# Patient Record
Sex: Female | Born: 1970
Health system: Southern US, Community
[De-identification: ages and names within clinical notes are randomized; demographics above are authoritative.]

## PROBLEM LIST (undated history)

## (undated) DIAGNOSIS — R0602 Shortness of breath: Secondary | ICD-10-CM

## (undated) DIAGNOSIS — D649 Anemia, unspecified: Secondary | ICD-10-CM

## (undated) DIAGNOSIS — E119 Type 2 diabetes mellitus without complications: Secondary | ICD-10-CM

## (undated) HISTORY — DX: Anemia, unspecified: D64.9

## (undated) HISTORY — DX: Shortness of breath: R06.02

---

## 1998-07-10 ENCOUNTER — Other Ambulatory Visit: Admission: RE | Admit: 1998-07-10 | Discharge: 1998-07-10 | Payer: Self-pay | Admitting: Obstetrics and Gynecology

## 1998-10-02 ENCOUNTER — Emergency Department (HOSPITAL_COMMUNITY): Admission: EM | Admit: 1998-10-02 | Discharge: 1998-10-02 | Payer: Self-pay | Admitting: Emergency Medicine

## 1998-10-02 ENCOUNTER — Encounter: Payer: Self-pay | Admitting: Emergency Medicine

## 2002-10-22 ENCOUNTER — Emergency Department (HOSPITAL_COMMUNITY): Admission: EM | Admit: 2002-10-22 | Discharge: 2002-10-22 | Payer: Self-pay | Admitting: Emergency Medicine

## 2004-10-24 ENCOUNTER — Emergency Department (HOSPITAL_COMMUNITY): Admission: EM | Admit: 2004-10-24 | Discharge: 2004-10-24 | Payer: Self-pay | Admitting: Emergency Medicine

## 2005-06-24 ENCOUNTER — Other Ambulatory Visit: Admission: RE | Admit: 2005-06-24 | Discharge: 2005-06-24 | Payer: Self-pay | Admitting: Obstetrics and Gynecology

## 2005-06-25 ENCOUNTER — Other Ambulatory Visit: Admission: RE | Admit: 2005-06-25 | Discharge: 2005-06-25 | Payer: Self-pay | Admitting: Obstetrics and Gynecology

## 2006-01-26 ENCOUNTER — Inpatient Hospital Stay (HOSPITAL_COMMUNITY): Admission: RE | Admit: 2006-01-26 | Discharge: 2006-01-29 | Payer: Self-pay | Admitting: Obstetrics and Gynecology

## 2006-10-25 ENCOUNTER — Encounter: Admission: RE | Admit: 2006-10-25 | Discharge: 2006-10-25 | Payer: Self-pay | Admitting: Occupational Medicine

## 2006-12-28 ENCOUNTER — Ambulatory Visit: Payer: Self-pay | Admitting: Family Medicine

## 2006-12-28 LAB — CONVERTED CEMR LAB
ALT: 10 units/L (ref 0–40)
Albumin: 3.5 g/dL (ref 3.5–5.2)
Alkaline Phosphatase: 100 units/L (ref 39–117)
BUN: 11 mg/dL (ref 6–23)
Basophils Relative: 0.9 % (ref 0.0–1.0)
CO2: 33 meq/L — ABNORMAL HIGH (ref 19–32)
Calcium: 8.9 mg/dL (ref 8.4–10.5)
Creatinine, Ser: 0.8 mg/dL (ref 0.4–1.2)
HDL: 44.5 mg/dL (ref >39.0)
LDL Cholesterol: 82 mg/dL (ref 0–99)
Monocytes Relative: 6.2 % (ref 3.0–11.0)
Platelets: 275 10*3/uL (ref 150–400)
RBC: 3.62 M/uL — ABNORMAL LOW (ref 3.87–5.11)
RDW: 12.8 % (ref 11.5–14.6)
Total Bilirubin: 0.7 mg/dL (ref 0.3–1.2)
Total CHOL/HDL Ratio: 3.1
Total Protein: 7.8 g/dL (ref 6.0–8.3)
Triglycerides: 61 mg/dL (ref 0–149)
VLDL: 12 mg/dL (ref 0–40)

## 2008-07-23 ENCOUNTER — Ambulatory Visit: Payer: Self-pay | Admitting: Family Medicine

## 2008-07-23 DIAGNOSIS — D649 Anemia, unspecified: Secondary | ICD-10-CM | POA: Insufficient documentation

## 2008-08-25 ENCOUNTER — Emergency Department (HOSPITAL_COMMUNITY): Admission: EM | Admit: 2008-08-25 | Discharge: 2008-08-25 | Payer: Self-pay | Admitting: Emergency Medicine

## 2008-08-26 ENCOUNTER — Ambulatory Visit: Payer: Self-pay | Admitting: Family Medicine

## 2008-08-27 ENCOUNTER — Encounter: Admission: RE | Admit: 2008-08-27 | Discharge: 2008-08-27 | Payer: Self-pay | Admitting: Family Medicine

## 2008-08-28 ENCOUNTER — Telehealth: Payer: Self-pay | Admitting: Family Medicine

## 2008-08-28 ENCOUNTER — Encounter: Admission: RE | Admit: 2008-08-28 | Discharge: 2008-08-28 | Payer: Self-pay | Admitting: Family Medicine

## 2008-09-03 ENCOUNTER — Ambulatory Visit: Payer: Self-pay | Admitting: Family Medicine

## 2008-09-11 ENCOUNTER — Emergency Department (HOSPITAL_COMMUNITY): Admission: EM | Admit: 2008-09-11 | Discharge: 2008-09-11 | Payer: Self-pay | Admitting: Emergency Medicine

## 2008-09-13 ENCOUNTER — Ambulatory Visit: Payer: Self-pay | Admitting: Family Medicine

## 2008-09-16 ENCOUNTER — Telehealth: Payer: Self-pay | Admitting: Family Medicine

## 2008-09-22 ENCOUNTER — Emergency Department (HOSPITAL_COMMUNITY): Admission: EM | Admit: 2008-09-22 | Discharge: 2008-09-22 | Payer: Self-pay | Admitting: Emergency Medicine

## 2008-10-30 ENCOUNTER — Encounter: Payer: Self-pay | Admitting: Family Medicine

## 2008-12-11 ENCOUNTER — Encounter: Payer: Self-pay | Admitting: Family Medicine

## 2009-05-21 ENCOUNTER — Emergency Department (HOSPITAL_COMMUNITY): Admission: EM | Admit: 2009-05-21 | Discharge: 2009-05-21 | Payer: Self-pay | Admitting: Family Medicine

## 2011-01-22 NOTE — Discharge Summary (Signed)
[name], Tammy Delgado              ACCOUNT NO.:  [id number]   MEDICAL RECORD NO.:  [id number]          PATIENT TYPE:  INP   LOCATION:  9125                          FACILITY:  [facility]   PHYSICIAN:  Malva Limes, M.D.    DATE OF BIRTH:  [birth year]   DATE OF ADMISSION:  01/26/2006  DATE OF DISCHARGE:  01/29/2006                                 DISCHARGE SUMMARY   DISCHARGE DIAGNOSIS:  Intrauterine pregnancy at 39-1/7 weeks' gestation with  history of prior cesarean section.  The patient desires repeat cesarean  section. r   PROCEDURE:  Repeat low segment transverse cesarean section.  Surgeon Dr.  Conley Simmonds, assistant Dr. Ilda Mori.  Complications none.   HISTORY OF PRESENT ILLNESS:  This is a 40 year old, G2, P1 presents at 59  weeks' gestation for repeat cesarean section.  She had a prior cesarean  section with her last pregnancy secondary to failure to progress and  throughout this pregnancy has expressed her desire for repeat cesarean  section and declined VBAC.  The patient's antepartum course up to this point  has been uncomplicated.  She did have an abnormal quad screen that showed  elevated risk for Down syndrome, but she declined amniocentesis.  Otherwise,  her antepartum course had been uncomplicated.  She was taken to the  operating room on Jan 26, 2006, by Dr. Conley Simmonds where repeat low segment  transverse cesarean section was performed with the delivery of an 8 pound  female infant with Apgar's of 8 and 9.  The delivery went without  complications.   HOSPITAL COURSE:  The patient's postoperative course was benign without any  significant fevers.  She did have some postoperative anemia.  Her hemoglobin  dropped to 7.8, but she was asymptomatic and doing well.  She was felt ready  for discharge on postop day #2.   DIET:  She was sent home on a regular diet.   ACTIVITY:  She was told to decrease her activities.   DISCHARGE MEDICATIONS:  1.  Continue her prenatal  vitamins and iron supplement for her anemia.  Make      sure she increase her fluids.  2.  Percocet one to two every 4 hours as needed for pain.   FOLLOW UP:  She was to follow up in our office in 4 weeks, of course, to  call with any increased pain, bleeding or temperatures.   DISCHARGE LABORATORY DATA AND X-RAY FINDINGS:  The patient had a hemoglobin  of 7.8, white blood cell count of 10.7, platelets at 227,000.  Her  preoperative hemoglobin was only 8.8, so this was not a huge drop.      Leilani Able, P.A.-C.    ______________________________  Malva Limes, M.D.    MB/MEDQ  D:  03/06/2006  T:  03/07/2006  Job:  [id number]

## 2011-01-22 NOTE — Op Note (Signed)
Tammy Delgado, Tammy Delgado              ACCOUNT NO.:  [id number]   MEDICAL RECORD NO.:  [id number]          PATIENT TYPE:  INP   LOCATION:  9125                          [facility]ACILITY:  [facility]   PHYSICIAN:  Randye Lobo, M.D.   DATE OF BIRTH:  November 13, 1970   DATE OF PROCEDURE:  01/26/2006  DATE OF DISCHARGE:                                 OPERATIVE REPORT   PREOPERATIVE DIAGNOSIS:  1.  Intrauterine gestation at 39+1 weeks.  2..  History of prior cesarean section, desire for repeat cesarean section.   POSTOPERATIVE DIAGNOSIS:  1..  Intrauterine gestation at 39+1 weeks.  2..  History of prior cesarean section, desire for repeat cesarean section.   PROCEDURE:  Repeat low segment transverse cesarean section.   SURGEON:  Randye Lobo, M.D.   ASSISTANT:  Ilda Mori, M.D.   ANESTHESIA:  Spinal.   IV FLUIDS:  3700 mL Ringer's lactate.   ESTIMATED BLOOD LOSS:  750 mL.   URINE OUTPUT:  150 mL.   COMPLICATIONS:  None.   INDICATIONS FOR THE PROCEDURE:  The patient is a 40 year old gravida 2, para  1, African-American female at 39+1 weeks' gestation by early ultrasound who  had a history of a prior cesarean section several years ago for failure to  progress.  The patient chose to proceed with a repeat cesarean section for  delivery for this pregnancy; after risks, benefits, and alternatives were  discussed with her.   FINDINGS:  A viable female was delivered at 3:13 p.m.  Apgars were 8 at 1  minute and 9 at 5 minutes.  The weight was 8 pounds.  The amniotic fluid was  clear.  The uterus, tubes, and ovaries were unremarkable.  The placenta had  a normal insertion of a 3-vessel cord.   SPECIMENS:  None.   DESCRIPTION OF PROCEDURE:  The patient was reidentified in the preoperative  hold area.  She was brought to the operating room where a spinal anesthetic  was administered.  The patient was then placed in a supine position; and the  abdomen was sterilely prepped and a Foley catheter was  placed in the  bladder.  She was sterilely draped.   A Pfannenstiel incision was created along the patient's previous  Pfannenstiel scar.  The incision was created sharply with the scalpel.  Hemostasis was created with monopolar cautery in the subcutaneous layer.  The fascia was then incised, in the midline, with a scalpel; and the an  incision was extended bilaterally with the Mayo scissors.  The rectus  muscles were sharply dissected off of the overlying fascia superiorly and  inferiorly.  The rectus muscles were sharply divided in the midline.  The  parietal peritoneum was entered sharply after it was elevated with the  hemostat clamp.  The incision was extended cranially and caudally.   The lower uterine segment was exposed; and the bladder flap was sharply  created.  A transverse lower uterine segment incision was created sharply  with a scalpel; and the incision was extended bilaterally in an upward  fashion with the bandage scissors.  Membranes were ruptured;  and the clear  fluid was noted.  A hand was inserted through the uterine incision and the  vertex was delivered through the incision without difficulty.  The shoulders  and the remainder of the infant were delivered.  The nares and mouth were  suctioned and the cord was doubly clamped and cut; and the newborn was  carried to the awaiting pediatrician in vigorous condition.  Cord blood was  obtained; and the placenta was then manually extracted.   The uterus was wiped clean with a moistened lap pad.  The uterine incision  was closed with a double layer closure of a #1 chromic.  The first was a  running locked layer; and the second was an imbricating layer.  An  additional figure-of-eight suture and then a simple suture of #1 chromic  were used to create hemostasis in the right midportion of the uterine  incision.   Hemostasis was good at this time; and the abdomen was closed.  The  peritoneum was closed with a running  suture of 2-0 Vicryl.  The rectus  muscles were reapproximated in the midline with interrupted sutures of #1  chromic.  The fascia was closed with #0 Vicryl in a running fashion.  The  subcutaneous tissue was irrigated and suctioned; and made hemostatic with  monopolar cautery.  The skin was closed with staples; and the sterile  bandage was placed over this.   This concluded the patient's procedure.  There were no complications.  All  needle, instruments, and sponge counts were correct.  The patient is  escorted to the recovery room in stable and awake condition.      Randye Lobo, M.D.  Electronically Signed     BES/MEDQ  D:  01/26/2006  T:  01/26/2006  Job:  [id number]

## 2011-01-22 NOTE — Assessment & Plan Note (Signed)
[facility]                            [facility] OFFICE NOTE   JENEVIEVE, Tammy Delgado                     MRN:          [id number]  DATE:12/28/2006                            DOB:          September 10, 1970    This is a 40 year old woman here to establish with our practice, she is  also here for a non-gynecological physical examination.  She is doing  well and has no complaints at all.   PAST MEDICAL HISTORY:  She has had 2 C-sections and had a history of  mild anemia.  Otherwise, her history if unremarkable.  She does see Dr.  Dareen Piano on a regular basis for gynecology exams.   ALLERGIES:  NONE.   MEDICATIONS:  None except for a multivitamin daily.   HABITS:  She does not use tobacco or alcohol.   SOCIAL HISTORY:  She is married with a 42 year old child and an 63-month-  old child.  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   FAMILY HISTORY:  Remarkable for hypertension and diabetes.   OBJECTIVE:  Height 5 foot 6 inches, weight 254, blood pressure 124/74,  pulse 72 and regular.  GENERAL:  She is quite obese.  SKIN:  Clear.  EYES:  Clear, she wears glasses.  EARS:  Clear.  PHARYNX:  Clear.  NECK:  Supple without lymphadenopathy or masses.  LUNGS:  Clear.  CARDIAC:  Rate and rhythm are regular without gallops, murmurs, or rubs.  Distal pulses are full.  ABDOMEN:  Soft, normal bowel sounds, nontender, no masses.  EXTREMITIES:  No clubbing, cyanosis, or edema.  NEUROLOGIC:  Grossly intact.   ASSESSMENT/PLAN:  1. Complete physical.  She is fasting so I will check the usual      laboratories.  2. Obesity.  She has been dieting and exercising but asks for my      assistance to help her lose weight.  I did write for phentermine 30      mg to take once daily #30 with 5 refills.     Tera Mater. Clent Ridges, MD  Electronically Signed    SAF/MedQ  DD: 12/28/2006  DT: 12/28/2006  Job #: [id number]

## 2011-06-11 LAB — CBC
HCT: 34.1 % — ABNORMAL LOW (ref 36.0–46.0)
Hemoglobin: 11.4 g/dL — ABNORMAL LOW (ref 12.0–15.0)
WBC: 6.6 10*3/uL (ref 4.0–10.5)

## 2011-06-11 LAB — POCT I-STAT, CHEM 8
Chloride: 101 mEq/L (ref 96–112)
Glucose, Bld: 148 mg/dL — ABNORMAL HIGH (ref 70–99)
HCT: 38 % (ref 36.0–46.0)
Hemoglobin: 12.9 g/dL (ref 12.0–15.0)
Potassium: 3.5 mEq/L (ref 3.5–5.1)
Sodium: 140 mEq/L (ref 135–145)

## 2011-06-11 LAB — URINALYSIS, ROUTINE W REFLEX MICROSCOPIC
Glucose, UA: NEGATIVE mg/dL
Ketones, ur: NEGATIVE mg/dL
Nitrite: NEGATIVE
Specific Gravity, Urine: 1.026 (ref 1.005–1.030)
pH: 6 (ref 5.0–8.0)

## 2011-06-11 LAB — DIFFERENTIAL
Eosinophils Relative: 3 % (ref 0–5)
Lymphocytes Relative: 24 % (ref 12–46)
Lymphs Abs: 1.6 10*3/uL (ref 0.7–4.0)
Monocytes Absolute: 0.5 10*3/uL (ref 0.1–1.0)
Neutro Abs: 4.3 10*3/uL (ref 1.7–7.7)

## 2012-09-06 LAB — HM DIABETES EYE EXAM

## 2013-01-07 ENCOUNTER — Emergency Department (HOSPITAL_COMMUNITY)
Admission: EM | Admit: 2013-01-07 | Discharge: 2013-01-07 | Disposition: A | Discharge status: Home / Self Care | Payer: 59 | Attending: Emergency Medicine | Admitting: Emergency Medicine

## 2013-01-07 ENCOUNTER — Encounter (HOSPITAL_COMMUNITY): Payer: Self-pay | Admitting: Emergency Medicine

## 2013-01-07 DIAGNOSIS — E119 Type 2 diabetes mellitus without complications: Secondary | ICD-10-CM

## 2013-01-07 DIAGNOSIS — E1169 Type 2 diabetes mellitus with other specified complication: Secondary | ICD-10-CM | POA: Insufficient documentation

## 2013-01-07 DIAGNOSIS — R5381 Other malaise: Secondary | ICD-10-CM | POA: Insufficient documentation

## 2013-01-07 DIAGNOSIS — R739 Hyperglycemia, unspecified: Secondary | ICD-10-CM

## 2013-01-07 DIAGNOSIS — R112 Nausea with vomiting, unspecified: Secondary | ICD-10-CM | POA: Insufficient documentation

## 2013-01-07 DIAGNOSIS — R5383 Other fatigue: Secondary | ICD-10-CM | POA: Insufficient documentation

## 2013-01-07 HISTORY — DX: Type 2 diabetes mellitus without complications: E11.9

## 2013-01-07 LAB — COMPREHENSIVE METABOLIC PANEL
Albumin: 3.9 g/dL (ref 3.5–5.2)
Alkaline Phosphatase: 134 U/L — ABNORMAL HIGH (ref 39–117)
BUN: 14 mg/dL (ref 6–23)
CO2: 26 mEq/L (ref 19–32)
Chloride: 93 mEq/L — ABNORMAL LOW (ref 96–112)
Creatinine, Ser: 0.85 mg/dL (ref 0.50–1.10)
GFR calc non Af Amer: 84 mL/min — ABNORMAL LOW (ref >90)
Glucose, Bld: 498 mg/dL — ABNORMAL HIGH (ref 70–99)
Potassium: 4.5 mEq/L (ref 3.5–5.1)
Total Bilirubin: 0.7 mg/dL (ref 0.3–1.2)

## 2013-01-07 LAB — URINALYSIS, ROUTINE W REFLEX MICROSCOPIC
Bilirubin Urine: NEGATIVE
Glucose, UA: >1000 mg/dL — AB
Hgb urine dipstick: NEGATIVE
Protein, ur: NEGATIVE mg/dL
Urobilinogen, UA: 0.2 mg/dL (ref 0.0–1.0)

## 2013-01-07 LAB — CBC WITH DIFFERENTIAL/PLATELET
Basophils Relative: 0 % (ref 0–1)
HCT: 37.3 % (ref 36.0–46.0)
Hemoglobin: 13.2 g/dL (ref 12.0–15.0)
Lymphocytes Relative: 27 % (ref 12–46)
Lymphs Abs: 2.9 10*3/uL (ref 0.7–4.0)
MCHC: 35.4 g/dL (ref 30.0–36.0)
Monocytes Absolute: 0.6 10*3/uL (ref 0.1–1.0)
Monocytes Relative: 5 % (ref 3–12)
Neutro Abs: 7.2 10*3/uL (ref 1.7–7.7)
Neutrophils Relative %: 67 % (ref 43–77)
RBC: 4.41 MIL/uL (ref 3.87–5.11)
WBC: 10.8 10*3/uL — ABNORMAL HIGH (ref 4.0–10.5)

## 2013-01-07 LAB — GLUCOSE, CAPILLARY
Glucose-Capillary: 487 mg/dL — ABNORMAL HIGH (ref 70–99)
Glucose-Capillary: 415 mg/dL — ABNORMAL HIGH (ref 70–99)
Glucose-Capillary: 398 mg/dL — ABNORMAL HIGH (ref 70–99)
Glucose-Capillary: 400 mg/dL — ABNORMAL HIGH (ref 70–99)
Glucose-Capillary: 238 mg/dL — ABNORMAL HIGH (ref 70–99)

## 2013-01-07 LAB — POCT PREGNANCY, URINE: Preg Test, Ur: NEGATIVE

## 2013-01-07 MED ORDER — ONDANSETRON HCL 4 MG/2ML IJ SOLN
4.0000 mg | Freq: Once | INTRAMUSCULAR | Status: AC
  Administered 2013-01-07: 4 mg via INTRAVENOUS
  Filled 2013-01-07: qty 2

## 2013-01-07 MED ORDER — SODIUM CHLORIDE 0.9 % IV SOLN
INTRAVENOUS | Status: DC
  Administered 2013-01-07: 4.3 [IU]/h via INTRAVENOUS
  Filled 2013-01-07: qty 1

## 2013-01-07 MED ORDER — SODIUM CHLORIDE 0.9 % IV SOLN
1000.0000 mL | Freq: Once | INTRAVENOUS | Status: AC
  Administered 2013-01-07: 1000 mL via INTRAVENOUS

## 2013-01-07 MED ORDER — SODIUM CHLORIDE 0.9 % IV SOLN
1000.0000 mL | INTRAVENOUS | Status: DC
  Administered 2013-01-07: 1000 mL via INTRAVENOUS

## 2013-01-07 NOTE — ED Provider Notes (Signed)
History     CSN: [id number]  Arrival date & time 01/07/13  2956   First MD Initiated Contact with Patient 01/07/13 [phone]      Chief Complaint  Patient presents with  . Emesis  . Fatigue  . Hyperglycemia    (Consider location/radiation/quality/duration/timing/severity/associated sxs/prior treatment) HPI  Patient to the ED with complaints of elevated glucose, emesis and fatigue. She has been feeling badly for 1 week and was seen 2 times at an Urgent care on [address]. They gave her a Rocephin shot and an Rx for amoxicillin because her throat has been sore, patient says, from large amounts of vomiting. She has also had urinary frequency and increased thirst. She also told this to the provider at the [facility]. On the second trip she had him check her urine and he saw that she had a large amount amount of sugar in her urine and diagnosed her with Diabetes and gave her Jentadueto. He did not check her CBG or any labs. He did give her 30 units of insulin and bolus of fluids. Over the past few days she continues to not feel well and checked her sugar on a family members meter and saw her sugar is in the 500s. Therefore she came to the ER. nad vss  Past Medical History  Diagnosis Date  . Diabetes mellitus without complication     History reviewed. No pertinent past surgical history.  History reviewed. No pertinent family history.  History  Substance Use Topics  . Smoking status: Never Smoker   . Smokeless tobacco: Not on file  . Alcohol Use: No    OB History   Grav Para Term Preterm Abortions TAB SAB Ect Mult Living                  Review of Systems  Constitutional: Positive for fatigue.  Gastrointestinal: Positive for nausea and vomiting. Negative for diarrhea.  All other systems reviewed and are negative.    Allergies  Review of patient's allergies indicates no known allergies.  Home Medications   Current Outpatient Rx  Name  Route  Sig  Dispense  Refill  .  Linagliptin-Metformin HCl (JENTADUETO) 2.5-500 MG TABS   Oral   Take 1 tablet by mouth 2 (two) times daily.           BP 116/66  Pulse 74  Temp(Src) 97.8 F (36.6 C)  Resp 16  Ht 5\' 6"  (1.676 m)  Wt 236 lb (107.049 kg)  BMI 38.11 kg/m2  SpO2 100%  Physical Exam  Nursing note and vitals reviewed. Constitutional: She appears well-developed and well-nourished. No distress.  HENT:  Head: Normocephalic and atraumatic.  Eyes: Pupils are equal, round, and reactive to light.  Neck: Normal range of motion. Neck supple.  Cardiovascular: Normal rate and regular rhythm.   Pulmonary/Chest: Effort normal.  Abdominal: Soft.  Neurological: She is alert.  Skin: Skin is warm and dry.    ED Course  Procedures (including critical care time)  Labs Reviewed  CBC WITH DIFFERENTIAL - Abnormal; Notable for the following:    WBC 10.8 (*)    All other components within normal limits  COMPREHENSIVE METABOLIC PANEL - Abnormal; Notable for the following:    Sodium 132 (*)    Chloride 93 (*)    Glucose, Bld 498 (*)    Total Protein 8.5 (*)    Alkaline Phosphatase 134 (*)    GFR calc non Af Amer 84 (*)    All other components  within normal limits  GLUCOSE, CAPILLARY - Abnormal; Notable for the following:    Glucose-Capillary 487 (*)    All other components within normal limits  GLUCOSE, CAPILLARY - Abnormal; Notable for the following:    Glucose-Capillary 456 (*)    All other components within normal limits  URINALYSIS, ROUTINE W REFLEX MICROSCOPIC - Abnormal; Notable for the following:    APPearance CLOUDY (*)    Specific Gravity, Urine 1.042 (*)    Glucose, UA >1000 (*)    Ketones, ur >80 (*)    All other components within normal limits  GLUCOSE, CAPILLARY - Abnormal; Notable for the following:    Glucose-Capillary 415 (*)    All other components within normal limits  GLUCOSE, CAPILLARY - Abnormal; Notable for the following:    Glucose-Capillary 398 (*)    All other components  within normal limits  GLUCOSE, CAPILLARY - Abnormal; Notable for the following:    Glucose-Capillary 400 (*)    All other components within normal limits  GLUCOSE, CAPILLARY - Abnormal; Notable for the following:    Glucose-Capillary 238 (*)    All other components within normal limits  URINE MICROSCOPIC-ADD ON  POCT PREGNANCY, URINE   No results found.   1. Diabetes   2. Hyperglycemia       MDM  CRITICAL CARE Performed by: Dorthula Matas   Total critical care time: 30  Critical care time was exclusive of separately billable procedures and treating other patients.  Critical care was necessary to treat or prevent imminent or life-threatening deterioration.  Critical care was time spent personally by me on the following activities: development of treatment plan with patient and/or surrogate as well as nursing, discussions with consultants, evaluation of patient's response to treatment, examination of patient, obtaining history from patient or surrogate, ordering and performing treatments and interventions, ordering and review of laboratory studies, ordering and review of radiographic studies, pulse oximetry and re-evaluation of patient's condition.   Patient started on gluco stabilizer to correct sugar. Labs and physical exam do not indicate patient is acidotic.  Patient is feeling much better. Has PO medications for sugar at home from Urgent Care yesterday. Has a PCP she has not seen in a long time and she has promised to follow-up for better, more thorough management of her diabetes.  12:51pm glucose is now 238.   Pt has been advised of the symptoms that warrant their return to the ED. Patient has voiced understanding and has agreed to follow-up with the PCP or specialist.      Dorthula Matas, PA-C 01/07/13 1328

## 2013-01-07 NOTE — ED Provider Notes (Signed)
Medical screening examination/treatment/procedure(s) were performed by non-physician practitioner and as supervising physician I was immediately available for consultation/collaboration.  Flint Melter, MD 01/07/13 1630

## 2013-01-07 NOTE — ED Notes (Addendum)
Pt states she has been having emesis and weakness for past week.  Went to PCP twice and was diagnosed with diabetes yesterday.  Home CBG was in 500s.  Pt states she is unable to keep any fluids down.  Had diarrhea yesterday but none today

## 2013-01-07 NOTE — ED Notes (Signed)
cbg 487

## 2013-01-08 ENCOUNTER — Telehealth: Payer: Self-pay | Admitting: Family Medicine

## 2013-01-08 ENCOUNTER — Ambulatory Visit (INDEPENDENT_AMBULATORY_CARE_PROVIDER_SITE_OTHER): Payer: 59 | Admitting: Family Medicine

## 2013-01-08 ENCOUNTER — Encounter: Payer: Self-pay | Admitting: Family Medicine

## 2013-01-08 VITALS — BP 114/78 | HR 87 | Temp 98.4°F | Wt 244.0 lb

## 2013-01-08 DIAGNOSIS — E119 Type 2 diabetes mellitus without complications: Secondary | ICD-10-CM

## 2013-01-08 MED ORDER — INSULIN ASPART PROT & ASPART (70-30 MIX) 100 UNIT/ML ~~LOC~~ SUSP: SUBCUTANEOUS | Status: DC

## 2013-01-08 NOTE — Telephone Encounter (Signed)
We spoke to her by phone. She is feeling fine at this time. Advised her to drink lots of water and to see [place] as scheduled this afternoon.

## 2013-01-08 NOTE — Progress Notes (Signed)
  Subjective:    Patient ID: Tammy Delgado, female    DOB: [birth year], 42 y.o.   MRN: [id number]  HPI Here to follow up an ER visit yesterday for newly diagnosed type 2 diabetes. She presented with fatigue, thirst, and frequent urinations. Her initial glucose was 487. She was given insulin and IV fluids, and this was down to 164 by the time she was sent home. Prior to that she had seen Urgent Care twice and was noted to have glucose in her urine. She was given samples of Jentadueto to take one tablet bid. She has done so for several days. Since going back home she has felt better although she is still somewhat fatigued. Her random glucoses at home this morning are back up to the 300s. Other than her gluocoses her labs in the ER were unremarkable. She had drops in her sodium and chloride as expected, but her potassium was normal. Her renal function is normal.    Review of Systems  Constitutional: Negative for fever.  Respiratory: Negative.   Cardiovascular: Negative.   Gastrointestinal: Negative.   Endocrine: Positive for polydipsia and polyuria.       Objective:   Physical Exam  Constitutional: She is oriented to person, place, and time. She appears well-developed and well-nourished. No distress.  Cardiovascular: Normal rate, regular rhythm, normal heart sounds and intact distal pulses.   Pulmonary/Chest: Effort normal and breath sounds normal.  Neurological: She is alert and oriented to person, place, and time.          Assessment & Plan:  Newly diagnosed diabetes. For now she will need insulin to get her glucoses under control. She may be able to convert to oral meds at some point. We gave her samples to start on Novolog Mix 70/30 to take 10 units in the am and 15 units in the pm. We will refer her to Endocrine ASAP and to Nutrition for education. Get a baseline A1c and urine microalbumin today. She will call us tomorrow with a report. We also discussed diet, exercise, and the need  to lose weight.

## 2013-01-08 NOTE — Telephone Encounter (Signed)
Patient Information:  Caller Name: Deltia  Phone: [phone]  Patient: Tammy Delgado, Tammy Delgado  Gender: Female  DOB: [birth year]  Age: 42 Years  PCP: Gershon Crane [facility])  Pregnant: No  Office Follow Up:  Does the office need to follow up with this patient?: Yes  Instructions For The Office: Caller glucose > 300mg /dL x 2.  Pt has appt @ 1545.  Please f/u w/ caller. regarding disposition.  Thank you.   Symptoms  Reason For Call & Symptoms: Emergent call.  Caller has an appt 5/5 for high blood glucose.  Caller glucose reading 465 mg/dL.  Pt was seen in ED 5/4 and treated for hyperglycemia.  Feeling weak. Pt first reading this am was 365 mg/dL.  Pt having fatigue no breathing problems, or confusion.  Reviewed Health History In EMR: N/A  Reviewed Medications In EMR: N/A  Reviewed Allergies In EMR: N/A  Reviewed Surgeries / Procedures: N/A  Date of Onset of Symptoms: 01/07/2013  Treatments Tried: Fluids Metformin  Treatments Tried Worked: No OB / GYN:  LMP: 12/19/2012  Guideline(s) Used:  Diabetes - High Blood Sugar  Disposition Per Guideline:   Discuss with PCP and Callback by Nurse within 1 Hour  Reason For Disposition Reached:   Blood glucose > 400 mg/dl (22 mmol/l)  Advice Given:  Liquids  Drink more fluids, at least 8-10 glasses daily (8 oz or 240 ml each glass).  Even more liquids are needed if there is fever, vomiting, or diarrhea.  Check Blood Glucose:  When you are ill, you should measure your blood glucose every 3-4 hours.  Write down the results.  Call Back If:  Rapid breathing occurs  You become worse or have more questions.  Patient Will Follow Care Advice:  YES

## 2013-01-09 ENCOUNTER — Encounter: Payer: Self-pay | Admitting: Family Medicine

## 2013-01-09 LAB — HEMOGLOBIN A1C: Hgb A1c MFr Bld: 11.9 % — ABNORMAL HIGH (ref 4.6–6.5)

## 2013-01-10 ENCOUNTER — Telehealth: Payer: Self-pay | Admitting: Family Medicine

## 2013-01-10 NOTE — Telephone Encounter (Signed)
I spoke with pt and gave new directions and changed in medication list.

## 2013-01-10 NOTE — Telephone Encounter (Signed)
Patient Information:  Caller Name: AMORE  Phone: [phone]  Patient: Tammy Delgado, Tammy Delgado  Gender: Female  DOB: Aug 11, 1971  Age: 42 Years  PCP: Gershon Crane [facility])  Pregnant: No  Office Follow Up:  Does the office need to follow up with this patient?: Yes  Instructions For The Office: OFFICE PLEASE FOLLOW UP WITH PT.  NEW DIABETIC PT AND HER BLOOD SUGAR IS OVER 400   Symptoms  Reason For Call & Symptoms: BS is over 400.  Taking Insulin as prescribed.  Pt was hoping Insulin would level out blood sugars but it has not (they are consistently over 300 and at times over 400).  Pt denies any vomiting  Reviewed Health History In EMR: Yes  Reviewed Medications In EMR: Yes  Reviewed Allergies In EMR: Yes  Reviewed Surgeries / Procedures: Yes  Date of Onset of Symptoms: 01/09/2013 OB / GYN:  LMP: 12/19/2012  Guideline(s) Used:  Diabetes - High Blood Sugar  Disposition Per Guideline:   Discuss with PCP and Callback by Nurse within 1 Hour  Reason For Disposition Reached:   Blood glucose > 400 mg/dl (22 mmol/l)  Advice Given:  N/A  Patient Will Follow Care Advice:  YES

## 2013-01-10 NOTE — Telephone Encounter (Signed)
Patient stated that she is feeling okay

## 2013-01-10 NOTE — Telephone Encounter (Signed)
Increase the insulin to 25 units in the am and 35 in the pm. Call back tomorrow

## 2013-01-11 ENCOUNTER — Encounter: Payer: 59 | Attending: Family Medicine | Admitting: *Deleted

## 2013-01-11 ENCOUNTER — Encounter: Payer: Self-pay | Admitting: *Deleted

## 2013-01-11 VITALS — Ht 66.0 in | Wt 244.3 lb

## 2013-01-11 DIAGNOSIS — E119 Type 2 diabetes mellitus without complications: Secondary | ICD-10-CM | POA: Insufficient documentation

## 2013-01-11 DIAGNOSIS — Z713 Dietary counseling and surveillance: Secondary | ICD-10-CM | POA: Insufficient documentation

## 2013-01-11 NOTE — Patient Instructions (Signed)
Goals:  Follow Diabetes Meal Plan as instructed  Eat 3 meals and 2 snacks, every 3-5 hrs  Limit carbohydrate intake to 30-45 grams carbohydrate/meal  Limit carbohydrate intake to 15 grams carbohydrate/snack  Add lean protein foods to meals/snacks  Monitor glucose levels as instructed by your doctor  Aim for 30 mins of physical activity daily  Bring food record and glucose log to your next nutrition visit 

## 2013-01-11 NOTE — Progress Notes (Signed)
  Patient was seen on 01/11/13 for the first of a series of three diabetes self-management courses at the [facility]. The following learning objectives were met by the patient during this course:   Defines the role of glucose and insulin  Identifies type of diabetes and pathophysiology  Defines the diagnostic criteria for diabetes and prediabetes  States the risk factors for Type 2 Diabetes  States the symptoms of Type 2 Diabetes  Defines Type 2 Diabetes treatment goals  Defines Type 2 Diabetes treatment options  States the rationale for glucose monitoring  Identifies A1C, glucose targets, and testing times  Identifies proper sharps disposal  Defines the purpose of a diabetes food plan  Identifies carbohydrate food groups  Defines effects of carbohydrate foods on glucose levels  Identifies carbohydrate choices/grams/food labels  States benefits of physical activity and effect on glucose  Review of suggested activity guidelines  Handouts given during class include:  Type 2 Diabetes: Basics Book  My Food Plan Book  Food and Activity Log    Follow-Up Plan: Attend core 2 and 3

## 2013-01-12 ENCOUNTER — Encounter: Payer: Self-pay | Admitting: Internal Medicine

## 2013-01-12 ENCOUNTER — Ambulatory Visit: Payer: 59 | Admitting: Internal Medicine

## 2013-01-12 ENCOUNTER — Ambulatory Visit (INDEPENDENT_AMBULATORY_CARE_PROVIDER_SITE_OTHER): Payer: 59 | Admitting: Internal Medicine

## 2013-01-12 VITALS — BP 128/76 | HR 73 | Temp 97.9°F | Resp 10 | Ht 66.0 in | Wt 248.0 lb

## 2013-01-12 DIAGNOSIS — E119 Type 2 diabetes mellitus without complications: Secondary | ICD-10-CM

## 2013-01-12 MED ORDER — INSULIN PEN NEEDLE 32G X 4 MM MISC: Status: DC

## 2013-01-12 MED ORDER — METFORMIN HCL 500 MG PO TABS: 1000.0000 mg | ORAL_TABLET | Freq: Two times a day (BID) | ORAL | Status: DC

## 2013-01-12 MED ORDER — INSULIN ASPART PROT & ASPART (70-30 MIX) 100 UNIT/ML ~~LOC~~ SUSP: SUBCUTANEOUS | Status: DC

## 2013-01-12 NOTE — Progress Notes (Addendum)
Patient ID: Tammy Delgado, female   DOB: Feb 10, 1971, 42 y.o.   MRN: [id number]  HPI: Tammy Delgado is a 42 y.o.-year-old female, referred by her PCP, Dr.Fry, for management of DM2, insulin-dependent, uncontrolled, without complications. She is here with her husband, who offers part of the history.   Patient has been diagnosed with diabetes on 01/07/13; both she and her husband had N/V/D after eating at a [employer] a day before - she did not recover after this and was very weak and fatigued. She presented to UC, and was dx with a stomach virus, hydrated, and sent home. She did not improve so she returned to the emergency room with fatigue, thirst, polyuria and her glucose was found 487. She was discharged on Jentadueto twice a day, and her sugars initially improved then started to increase in the 300s again. At that time, patient saw her PCP on 01/08/2013, and she was started on NovoLog 70/30, 10 units in a.m. and 15 in PM. This dose was subsequently increased to 25 units in a.m. and 35 units in p.m. after sugars increased to 510.   A hemoglobin A1c was: Lab Results  Component Value Date   HGBA1C 11.9* 01/08/2013   Pt is on a regimen of: - NovoLog insulin 70/30 25 units in a.m. and 35 units in p.m. She tolerates the injections well, keeps insulin in the fridge.   Pt checks her sugars 4-5 a day and brings her log, which we reviewed together. Her sugars are: - am: 331-443, however this morning 184. - 2 hours after breakfast she has 3 measurements of 441, then 343, then 263 - lunchtime: 2 measurements: 206 and 340 - Before dinner 2 measurements 340 and 389 - 2 hours after dinner 428, then 282, then 215 - Bedtime: 1 measurement: 217 - midnight: 1 measurements: 248 last night Overall, the sugars are decreasing compared to 4 days ago. She increased her dose of 70/30 insulin 2 days ago. No lows. Lowest sugar was 184 today; unknown if she has hypoglycemia awareness. Highest sugar was  510, as mentioned above.  Pt's meals are: - Breakfast: does not eat b'fast, maybe a banana, soda - used to drink regular pepsi - 2L a day >> after saw nutrition, she will start to eat b'fast (to include proteins) - Lunch: nab and used to drink a soda - Dinner: steak, potato salad and soda or pork chops - Snacks: no snacks She saw Denny Levy with nutrition on 01/08/2013.  She started to exercise (going to the gym): treadmill and bike, not since she got sick  Pt does not have chronic kidney disease, last BUN/creatinine was:  Lab Results  Component Value Date   BUN 14 01/07/2013   CREATININE 0.85 01/07/2013  An albumin to creatinine ratio was 0.5.  Last set of lipids: Lab Results  Component Value Date   CHOL 139 12/28/2006   HDL 44.5 12/28/2006   LDLCALC 82 12/28/2006   TRIG 61 12/28/2006   CHOLHDL 3.1 CALC 12/28/2006   Pt's last eye exam was in 09/06/2012. No DR. Denies numbness and tingling in her legs.   I reviewed her chart and she also has a history of morbid obesity, anemia.  Pt has FH of DM in mother and MGM.  ROS: Constitutional: + weight loss (15 lbs in 1 week), + fatigue, decreased appetite, no subjective hyperthermia/hypothermia, + poor sleep, increased urination Eyes: + blurry vision, no xerophthalmia ENT: no sore throat, no nodules palpated in throat, no  dysphagia/odynophagia, no hoarseness Cardiovascular: no CP/SOB/palpitations/leg swelling Respiratory: no cough/SOB Gastrointestinal: no N/V/D/C Musculoskeletal: no muscle/joint aches Skin: no rashes Neurological: no tremors/numbness/tingling/dizziness Psychiatric: no depression/anxiety Low libido  Past Medical History  Diagnosis Date  . Diabetes mellitus without complication    Past Surgical History  Procedure Laterality Date  . Cesarean section  1995 and 2007   History   Social History  . Marital Status: Married    Number of Children: 2 - 109 and 71 y/o   Occupational History  . nurse   Social History  Main Topics  . Smoking status: Never Smoker   . Smokeless tobacco: Never Used  . Alcohol Use: No  . Drug Use: No  . Sexually Active: Not on file   Allergies  Allergen Reactions  . Chicken Allergy    Family History  Problem Relation Age of Onset  . Diabetes Mother   . Hypertension Mother   . Hypertension Maternal Grandmother   . Diabetes Maternal Grandmother    PE: BP 128/76  Pulse 73  Temp(Src) 97.9 F (36.6 C) (Oral)  Resp 10  Ht 5\' 6"  (1.676 m)  Wt 248 lb (112.492 kg)  BMI 40.05 kg/m2  SpO2 97%  LMP 12/19/2012 Wt Readings from Last 3 Encounters:  01/12/13 248 lb (112.492 kg)  01/11/13 244 lb 4.8 oz (110.814 kg)  01/08/13 244 lb (110.678 kg)   Constitutional: obese, in NAD Eyes: PERRLA, EOMI, no exophthalmos ENT: moist mucous membranes, no thyromegaly, no cervical lymphadenopathy Cardiovascular: RRR, No MRG Respiratory: CTA B Gastrointestinal: abdomen soft, NT, ND, BS+ Musculoskeletal: no deformities, strength intact in all 4 Skin: moist, warm, no rashes Neurological: no tremor with outstretched hands, DTR normal in all 4  ASSESSMENT: 1. DM2, insulin-dependent, uncontrolled, without complications - new dx  PLAN:  1. Patient with newly diagnosed diabetes type 2, recently started on insulin 70/30, and her sugars starting to decrease in the last 2 days. - We discussed about the diagnosis of diabetes, short-term and long-term complications and the way to avoid them - We reviewed insulin administration, advised her to continue to use the abdomen for injections and to keep the insulin in use out of the refrigerator - Discussed the best way to check her sugars, and she is doing a great job - I refilled her 70/30 NovoLog insulin, will maintain it at the same dose, and I strongly encouraged her to start eating breakfast. She feels that she cannot do this, we'll need to change the regimen. However, since her appointment with nutrition, the patient is returning to institute  healthy changes in her diet, which include eating a nutritious breakfast. She has cut out regular sodas she does not like diet sodas, so she is drinking mostly water. I also advised her to restart going to the gym.  - The patient is feeling poorly right now, with lack of energy, blurry vision and frequent urination, she would like me to give her a note to stay out from work up until 01/22/2013 (done). - we discussed about the addition of metformin, which I would strongly recommend, and I gave her a plan to increase the medication slowly to target dose - given foot care handout and explained the principles - given instructions for hypoglycemia management "15-15 rule" - she is up-to-date with her eye exam and I performed a foot exam today - normal - advised her to join MyChart - she will send me the name of her meter through MyChart - she needs refills of Lancets  and strips - given general instructions about DM2 - I will see her back in 3 weeks with her sugar log  Pt wanted me to check her sugar: 270

## 2013-01-12 NOTE — Progress Notes (Signed)
Quick Note:  Left a message for return call. ______ 

## 2013-01-12 NOTE — Patient Instructions (Addendum)
Please return in 3 weeks with your sugar log. Continue to check sugars as you are. Please continue your current insulin regimen. Inject insulin 15 min before meals. Please start Metformin 500 mg with dinner x 5 days. If you tolerate this well, add another Metformin tablet (500 mg) with breakfast x 5 days. If you tolerate this well, at another metformin tablet with dinner (1000 mg) x 5 days. If you tolerate this well, had another metformin tablets with breakfast (1000 mg). Continue with 1000 mg of metformin twice a day with breakfast and dinner.  PATIENT INSTRUCTIONS FOR TYPE 2 DIABETES:  **Please join MyChart!** - see attached instructions about how to join   DIET AND EXERCISE Diet and exercise is an important part of diabetic treatment.  We recommended aerobic exercise in the form of brisk walking (working between 40-60% of maximal aerobic capacity, similar to brisk walking) for 150 minutes per week (such as 30 minutes five days per week) along with 3 times per week performing 'resistance' training (using various gauge rubber tubes with handles) 5-10 exercises involving the major muscle groups (upper body, lower body and core) performing 10-15 repetitions (or near fatigue) each exercise. Start at half the above goal but build slowly to reach the above goals. If limited by weight, joint pain, or disability, we recommend daily walking in a swimming pool with water up to waist to reduce pressure from joints while allow for adequate exercise.    BLOOD GLUCOSES Monitoring your blood glucoses is important for continued management of your diabetes. Please check your blood glucoses 2-4 times a day: fasting, before meals and at bedtime (you can rotate these measurements - e.g. one day check before the 3 meals, the next day check before 2 of the meals and before bedtime, etc.   HYPOGLYCEMIA (low blood sugar) Hypoglycemia is usually a reaction to not eating, exercising, or taking too much insulin/ other  diabetes drugs.  Symptoms include tremors, sweating, hunger, confusion, headache, etc. Treat IMMEDIATELY with 15 grams of Carbs:   4 glucose tablets    cup regular juice/soda   2 tablespoons raisins   4 teaspoons sugar   1 tablespoon honey Recheck blood glucose in 15 mins and repeat above if still symptomatic/blood glucose <100. Please contact our office at [phone] if you have questions about how to next handle your insulin.  RECOMMENDATIONS TO REDUCE YOUR RISK OF DIABETIC COMPLICATIONS: * Take your prescribed MEDICATION(S). * Follow a DIABETIC diet: Complex carbs, fiber rich foods, heart healthy fish twice weekly, (monounsaturated and polyunsaturated) fats * AVOID saturated/trans fats, high fat foods, >2,300 mg salt per day. * EXERCISE at least 5 times a week for 30 minutes or preferably daily.  * DO NOT SMOKE OR DRINK more than 1 drink a day. * Check your FEET every day. Do not wear tightfitting shoes. Contact us if you develop an ulcer * See your EYE doctor once a year or more if needed * Get a FLU shot once a year * Get a PNEUMONIA vaccine once before and once after age 73 years  GOALS:  * Your Hemoglobin A1c of <7%  * Your Systolic BP should be 140 or lower  * Your Diastolic BP should be 80 or lower  * Your HDL (Good Cholesterol) should be 40 or higher  * Your LDL (Bad Cholesterol) should be 100 or lower  * Your Triglycerides should be 150 or lower  * Your Urine microalbumin (kidney function) should be <30 * Your Body Mass  Index should be 25 or lower   We will be glad to help you achieve these goals. Our telephone number is: [phone].

## 2013-01-15 NOTE — Progress Notes (Signed)
Quick Note:  I spoke with pt ______ 

## 2013-01-22 ENCOUNTER — Ambulatory Visit (INDEPENDENT_AMBULATORY_CARE_PROVIDER_SITE_OTHER): Payer: 59 | Admitting: Family Medicine

## 2013-01-22 ENCOUNTER — Encounter: Payer: Self-pay | Admitting: Family Medicine

## 2013-01-22 VITALS — BP 126/78 | HR 96 | Temp 98.1°F | Wt 250.0 lb

## 2013-01-22 DIAGNOSIS — E119 Type 2 diabetes mellitus without complications: Secondary | ICD-10-CM

## 2013-01-22 MED ORDER — INSULIN ASPART PROT & ASPART (70-30 MIX) 100 UNIT/ML ~~LOC~~ SUSP: 10.0000 [IU] | Freq: Two times a day (BID) | SUBCUTANEOUS | Status: DC

## 2013-01-22 MED ORDER — METFORMIN HCL 500 MG PO TABS: 500.0000 mg | ORAL_TABLET | Freq: Two times a day (BID) | ORAL | Status: DC

## 2013-01-22 NOTE — Progress Notes (Signed)
  Subjective:    Patient ID: Tammy Delgado, female    DOB: Jul 01, 1971, 42 y.o.   MRN: [id number]  HPI Here to follow up on newly diagnosed diabetes. We diagnosed her and started her on 70/30 insulin bid dosing at 25 units in the am and 35 in the pm. Her glucoses wwere in the 300s and 400s at that time. After starting this regimen her glucoses quickly dropped an even became too low. She has been getting am fasting glucoses of 68 to 93. She has made some significant dietary changes. She saw Dr. Elvera Lennox once , who suggested she keep this insulin regimen and she proposed adding Metformin to the mix. The patient has been very reluctant to start the oral meds however because she has bee getting very low glucose readings.    Review of Systems  Constitutional: Negative.   Respiratory: Negative.   Cardiovascular: Negative.   Neurological: Positive for light-headedness.       Objective:   Physical Exam  Constitutional: She is oriented to person, place, and time. She appears well-developed and well-nourished. No distress.  Cardiovascular: Normal rate, regular rhythm, normal heart sounds and intact distal pulses.   Pulmonary/Chest: Effort normal and breath sounds normal.  Neurological: She is alert and oriented to person, place, and time.          Assessment & Plan:  She has had some significant swings in her serum glucose levels. We start her on Metformin 500 mg bid but also cut the insulin to 10 in the am and 10 in the pm. Recheck in 2 weeks.

## 2013-01-23 DIAGNOSIS — Z0279 Encounter for issue of other medical certificate: Secondary | ICD-10-CM

## 2013-02-01 ENCOUNTER — Encounter: Payer: 59 | Admitting: Dietician

## 2013-02-01 DIAGNOSIS — E119 Type 2 diabetes mellitus without complications: Secondary | ICD-10-CM

## 2013-02-01 NOTE — Progress Notes (Signed)
  Patient was seen on 02/01/2013 for the second of a series of three diabetes self-management courses at the [facility]. The following learning objectives were met by the patient during this course:   Explain basic meter maintenance and quality assurance  Describe causes, symptoms and treatment of hypoglycemia and hyperglycemia  Explain how to manage diabetes during illness  Describe the importance of good nutrition for health and healthy eating strategies  List strategies to follow meal plan when dining out  Describe the effects of alcohol on glucose and how to use it safely  Describe problem solving skills for day-to-day glucose challenges  Describe strategies to use when treatment plan needs to change  Identify important factors involved in successful weight loss  Describe ways to remain physically active  Describe the impact of regular activity on insulin resistance  Identify diabetes medications being personally used and their primary action for lowering blood glucose and possible side effects  Handouts given in class:  Refrigerator magnet for Sick Day Guidelines  [facility] Oral medication/insulin handout  Nutritional Strategies for Weight Loss with Diabetes   Follow-Up Plan: Patient will attend the final class of the ADA Diabetes Self-Care Education.    

## 2013-02-23 ENCOUNTER — Telehealth: Payer: Self-pay | Admitting: Family Medicine

## 2013-02-23 NOTE — Telephone Encounter (Signed)
Pt is aware info requesting from short term disability has been faxed

## 2013-02-23 NOTE — Telephone Encounter (Signed)
Can you please help with this? I don't see anything scanned, tried to call this number but was put on hold.

## 2013-02-23 NOTE — Telephone Encounter (Signed)
Pt said that something was missing off of the short term disability form, can we check on this and call pt. [phone] ) Call this number to request forms again.

## 2013-02-27 ENCOUNTER — Ambulatory Visit (INDEPENDENT_AMBULATORY_CARE_PROVIDER_SITE_OTHER): Payer: 59 | Admitting: Family Medicine

## 2013-02-27 ENCOUNTER — Encounter: Payer: Self-pay | Admitting: Family Medicine

## 2013-02-27 VITALS — BP 124/78 | HR 75 | Temp 98.0°F | Wt 249.0 lb

## 2013-02-27 DIAGNOSIS — E119 Type 2 diabetes mellitus without complications: Secondary | ICD-10-CM

## 2013-02-27 DIAGNOSIS — M76899 Other specified enthesopathies of unspecified lower limb, excluding foot: Secondary | ICD-10-CM

## 2013-02-27 DIAGNOSIS — M7061 Trochanteric bursitis, right hip: Secondary | ICD-10-CM

## 2013-02-27 MED ORDER — HYDROCODONE-ACETAMINOPHEN 5-325 MG PO TABS: 1.0000 | ORAL_TABLET | Freq: Four times a day (QID) | ORAL | Status: DC | PRN

## 2013-02-27 MED ORDER — METFORMIN HCL 500 MG PO TABS: 500.0000 mg | ORAL_TABLET | Freq: Two times a day (BID) | ORAL | Status: DC

## 2013-02-27 NOTE — Progress Notes (Signed)
  Subjective:    Patient ID: Tammy Delgado, female    DOB: [birth year], 42 y.o.   MRN: [id number]  HPI Here for several things. We are following up on recently diagnosed diabetes. She has still been getting low glucose readings despite cutting her insulin dosing down to just 10 units once a day in the evenings. She has been getting readings of 60s and 70s. Taking Metformin once a day in the mornings. She also has had recurrent pains in the lateral right thigh lately. She had some Oxycontin pills at home she used with good results.    Review of Systems  Constitutional: Negative.   Respiratory: Negative.   Cardiovascular: Negative.   Musculoskeletal: Positive for arthralgias.       Objective:   Physical Exam  Constitutional: She appears well-developed and well-nourished.  Musculoskeletal:  Tender over the right greater trochanter          Assessment & Plan:  She has attended 2 classes with the diabetic nutritionist and she has made great changes in her diet. We will stop her insulin completely. Use Metformin alone but increase the dose to 500 mg bid. Try Vicodin and heat for the bursitis.

## 2013-03-21 ENCOUNTER — Ambulatory Visit: Payer: 59

## 2014-02-13 ENCOUNTER — Ambulatory Visit (INDEPENDENT_AMBULATORY_CARE_PROVIDER_SITE_OTHER): Payer: BC Managed Care – PPO | Admitting: Family Medicine

## 2014-02-13 ENCOUNTER — Encounter: Payer: Self-pay | Admitting: Family Medicine

## 2014-02-13 VITALS — BP 109/81 | HR 85 | Temp 98.3°F | Ht 66.0 in | Wt 249.0 lb

## 2014-02-13 DIAGNOSIS — E119 Type 2 diabetes mellitus without complications: Secondary | ICD-10-CM

## 2014-02-13 MED ORDER — PHENTERMINE HCL 37.5 MG PO CAPS: 37.5000 mg | ORAL_CAPSULE | ORAL | Status: DC

## 2014-02-13 MED ORDER — METFORMIN HCL 500 MG PO TABS: 500.0000 mg | ORAL_TABLET | Freq: Two times a day (BID) | ORAL | Status: DC

## 2014-02-13 MED ORDER — INSULIN ASPART PROT & ASPART (70-30 MIX) 100 UNIT/ML ~~LOC~~ SUSP: 15.0000 [IU] | Freq: Two times a day (BID) | SUBCUTANEOUS | Status: DC

## 2014-02-13 NOTE — Progress Notes (Signed)
   Subjective:    Patient ID: Tammy Delgado, female    DOB: 22-Aug-1971, 43 y.o.   MRN: [id number]  HPI Here to follow up. She feels well and her am fasting glucoses range from 110 to 140. She is trying to lose weight before a wedding she will participate in late this summer.    Review of Systems  Constitutional: Negative.   Respiratory: Negative.   Cardiovascular: Negative.        Objective:   Physical Exam  Constitutional: She appears well-developed and well-nourished.  Cardiovascular: Normal rate, regular rhythm, normal heart sounds and intact distal pulses.   Pulmonary/Chest: Effort normal and breath sounds normal.          Assessment & Plan:  Get labs. Advised her to exercise

## 2014-02-13 NOTE — Progress Notes (Signed)
Pre visit review using our clinic review tool, if applicable. No additional management support is needed unless otherwise documented below in the visit note. 

## 2014-03-18 ENCOUNTER — Telehealth: Payer: Self-pay | Admitting: Family Medicine

## 2014-03-18 MED ORDER — INSULIN PEN NEEDLE 32G X 4 MM MISC: Status: DC

## 2014-03-18 NOTE — Telephone Encounter (Signed)
I sent script e-scribe. 

## 2014-03-18 NOTE — Telephone Encounter (Signed)
Pt is needing new rx insulin pin needles 32 gage by 4mm reli on pin needles, pt states dr. Clent RidgesFry wrote the rx for the flex pens but no needles, send to [facility] on [address].

## 2014-04-15 ENCOUNTER — Telehealth: Payer: Self-pay

## 2014-04-15 NOTE — Telephone Encounter (Signed)
Attempted to reach pt. Voicemail not set up and one contact # is a fax. Letter sent

## 2014-12-05 ENCOUNTER — Telehealth: Payer: Self-pay | Admitting: Family Medicine

## 2014-12-05 MED ORDER — INSULIN ASPART PROT & ASPART (70-30 MIX) 100 UNIT/ML ~~LOC~~ SUSP: 15.0000 [IU] | Freq: Two times a day (BID) | SUBCUTANEOUS | Status: DC

## 2014-12-05 NOTE — Telephone Encounter (Signed)
Pt states she was to use sliding scale for insulin, and had to use more than prescribed.  Now she is out and pharm will not refill w/out  md authorization.  insulin aspart protamine- aspart (NOVOLOG MIX 70/30) (70-30) 100 UNIT/ML injection  [facility]

## 2014-12-05 NOTE — Telephone Encounter (Signed)
Received a fax from the pharmacy stating pt says she is taking more, please adivse.  Called and spoke with pt and pt states that at her last appt Dr. Clent RidgesFry told her to take 15 units in the morning and depending on the bs reading in the afternoon the patient can give herself between 15-45 units.  Pt states the pharmacy told her the prescription that was sent in will not cover her for 30 days.   Called the pharmacy and spoke with Katrina the pharmacist and she states it was too early for pt to get the refill according to her previous instructions.  Per Katrina she will try to make the directions "Take 15 units in the morning and 15-30 in the evening and she will see if the insurance will cover the prescription that way.   Called and advised pt of information and also advised that pt follow up with Dr. Clent RidgesFry to have labs and an office visit.  Pt states she will call the office back to make an appt.

## 2014-12-05 NOTE — Telephone Encounter (Signed)
I sent script e-scribe, tried to reach pt and voice mail was full.

## 2015-01-04 ENCOUNTER — Other Ambulatory Visit: Payer: Self-pay | Admitting: Family Medicine

## 2015-02-28 ENCOUNTER — Telehealth: Payer: Self-pay | Admitting: *Deleted

## 2015-02-28 DIAGNOSIS — E119 Type 2 diabetes mellitus without complications: Secondary | ICD-10-CM

## 2015-02-28 NOTE — Telephone Encounter (Signed)
Patient is coming in 03/04/15 for labs.  A1c was ordered. Patient states that she needs more labs done and will come in fasting.  Please add labs needed. thanks

## 2015-03-03 ENCOUNTER — Other Ambulatory Visit: Payer: Self-pay | Admitting: Family Medicine

## 2015-03-03 DIAGNOSIS — E119 Type 2 diabetes mellitus without complications: Secondary | ICD-10-CM

## 2015-03-03 NOTE — Telephone Encounter (Signed)
The labs were ordered  

## 2015-03-04 ENCOUNTER — Other Ambulatory Visit (INDEPENDENT_AMBULATORY_CARE_PROVIDER_SITE_OTHER): Payer: BLUE CROSS/BLUE SHIELD

## 2015-03-04 DIAGNOSIS — E119 Type 2 diabetes mellitus without complications: Secondary | ICD-10-CM | POA: Diagnosis not present

## 2015-03-04 LAB — HEPATIC FUNCTION PANEL
ALT: 8 U/L (ref 0–35)
AST: 10 U/L (ref 0–37)
Albumin: 3.7 g/dL (ref 3.5–5.2)
Alkaline Phosphatase: 113 U/L (ref 39–117)
Bilirubin, Direct: 0 mg/dL (ref 0.0–0.3)
TOTAL PROTEIN: 7.5 g/dL (ref 6.0–8.3)
Total Bilirubin: 0.6 mg/dL (ref 0.2–1.2)

## 2015-03-04 LAB — CBC WITH DIFFERENTIAL/PLATELET
BASOS ABS: 0 10*3/uL (ref 0.0–0.1)
Basophils Relative: 0.4 % (ref 0.0–3.0)
EOS ABS: 0.2 10*3/uL (ref 0.0–0.7)
Eosinophils Relative: 1.9 % (ref 0.0–5.0)
HCT: 35.7 % — ABNORMAL LOW (ref 36.0–46.0)
Hemoglobin: 12 g/dL (ref 12.0–15.0)
LYMPHS ABS: 2.9 10*3/uL (ref 0.7–4.0)
Lymphocytes Relative: 33.1 % (ref 12.0–46.0)
MCHC: 33.6 g/dL (ref 30.0–36.0)
MCV: 88.6 fl (ref 78.0–100.0)
MONO ABS: 0.5 10*3/uL (ref 0.1–1.0)
Monocytes Relative: 5.7 % (ref 3.0–12.0)
NEUTROS ABS: 5.2 10*3/uL (ref 1.4–7.7)
Neutrophils Relative %: 58.9 % (ref 43.0–77.0)
PLATELETS: 279 10*3/uL (ref 150.0–400.0)
RBC: 4.03 Mil/uL (ref 3.87–5.11)
RDW: 13.6 % (ref 11.5–15.5)
WBC: 8.8 10*3/uL (ref 4.0–10.5)

## 2015-03-04 LAB — LIPID PANEL
CHOLESTEROL: 164 mg/dL (ref 0–200)
HDL: 39.4 mg/dL (ref >39.00)
LDL Cholesterol: 110 mg/dL — ABNORMAL HIGH (ref 0–99)
NonHDL: 124.6
Total CHOL/HDL Ratio: 4
Triglycerides: 74 mg/dL (ref 0.0–149.0)
VLDL: 14.8 mg/dL (ref 0.0–40.0)

## 2015-03-04 LAB — BASIC METABOLIC PANEL
BUN: 10 mg/dL (ref 6–23)
CHLORIDE: 98 meq/L (ref 96–112)
CO2: 30 mEq/L (ref 19–32)
CREATININE: 0.81 mg/dL (ref 0.40–1.20)
Calcium: 9.4 mg/dL (ref 8.4–10.5)
GFR: 98.98 mL/min (ref >60.00)
Glucose, Bld: 342 mg/dL — ABNORMAL HIGH (ref 70–99)
POTASSIUM: 4.1 meq/L (ref 3.5–5.1)
Sodium: 135 mEq/L (ref 135–145)

## 2015-03-04 LAB — POCT URINALYSIS DIPSTICK
BILIRUBIN UA: NEGATIVE
LEUKOCYTES UA: NEGATIVE
NITRITE UA: NEGATIVE
PH UA: 5.5
Protein, UA: NEGATIVE
RBC UA: NEGATIVE
Spec Grav, UA: 1.02
Urobilinogen, UA: 0.2

## 2015-03-04 LAB — HEMOGLOBIN A1C: Hgb A1c MFr Bld: 10.7 % — ABNORMAL HIGH (ref 4.6–6.5)

## 2015-03-04 LAB — TSH: TSH: 2.94 u[IU]/mL (ref 0.35–4.50)

## 2015-04-04 ENCOUNTER — Ambulatory Visit (INDEPENDENT_AMBULATORY_CARE_PROVIDER_SITE_OTHER): Payer: BLUE CROSS/BLUE SHIELD | Admitting: Family Medicine

## 2015-04-04 ENCOUNTER — Encounter: Payer: Self-pay | Admitting: Family Medicine

## 2015-04-04 VITALS — BP 101/73 | HR 75 | Temp 98.0°F | Wt 260.0 lb

## 2015-04-04 DIAGNOSIS — E119 Type 2 diabetes mellitus without complications: Secondary | ICD-10-CM

## 2015-04-04 DIAGNOSIS — R5383 Other fatigue: Secondary | ICD-10-CM

## 2015-04-04 LAB — VITAMIN B12: Vitamin B-12: 412 pg/mL (ref 211–911)

## 2015-04-04 MED ORDER — METFORMIN HCL 1000 MG PO TABS: 1000.0000 mg | ORAL_TABLET | Freq: Two times a day (BID) | ORAL | Status: DC

## 2015-04-04 MED ORDER — INSULIN ASPART PROT & ASPART (70-30 MIX) 100 UNIT/ML ~~LOC~~ SUSP: SUBCUTANEOUS | Status: DC

## 2015-04-04 MED ORDER — PHENTERMINE HCL 37.5 MG PO CAPS: 37.5000 mg | ORAL_CAPSULE | ORAL | Status: DC

## 2015-04-04 NOTE — Addendum Note (Signed)
Addended by: Bonnye Fava on: 04/04/2015 04:07 PM   Modules accepted: Orders

## 2015-04-04 NOTE — Progress Notes (Signed)
   Subjective:    Patient ID: Tammy Delgado, female    DOB: 1971/05/06, 44 y.o.   MRN: [id number]  HPI Here to follow up on diabetes. She has put on some weight and has gotten off her diet lately. This is reflected in the recent A1c of 10.7. She has gradually increased the dose of her insulin on her own, so that she is now using 30 units in the am and 40-45 units in the pm.    Review of Systems  Constitutional: Negative.   Respiratory: Negative.   Cardiovascular: Negative.   Gastrointestinal: Negative.   Endocrine: Negative.        Objective:   Physical Exam  Constitutional: She appears well-developed and well-nourished.  Cardiovascular: Normal rate, normal heart sounds and intact distal pulses.   Pulmonary/Chest: Effort normal and breath sounds normal.          Assessment & Plan:  Her diabetes is not well controlled and she realizes that she needs to exercise and to follow a strict diet. We will in crease the Metformin to 1000 mg bid.

## 2015-04-04 NOTE — Addendum Note (Signed)
Addended by: Bonnye Fava on: 04/04/2015 04:14 PM   Modules accepted: Orders

## 2015-04-05 ENCOUNTER — Other Ambulatory Visit: Payer: Self-pay | Admitting: Family Medicine

## 2015-04-07 ENCOUNTER — Other Ambulatory Visit: Payer: Self-pay | Admitting: *Deleted

## 2015-04-07 MED ORDER — INSULIN PEN NEEDLE 32G X 4 MM MISC: Status: DC

## 2015-06-06 ENCOUNTER — Other Ambulatory Visit: Payer: Self-pay | Admitting: Family Medicine

## 2016-04-05 ENCOUNTER — Telehealth: Payer: Self-pay | Admitting: Family Medicine

## 2016-04-05 NOTE — Telephone Encounter (Signed)
Pt needs novolog today

## 2016-04-05 NOTE — Telephone Encounter (Signed)
Script was sent in earlier.

## 2016-04-05 NOTE — Telephone Encounter (Signed)
Pt need new Rx for novolog  Pharm:  [facility]  Pt state she is completely out and need some for today.

## 2016-04-13 ENCOUNTER — Ambulatory Visit: Payer: BLUE CROSS/BLUE SHIELD | Admitting: Family Medicine

## 2016-04-13 ENCOUNTER — Encounter: Payer: Self-pay | Admitting: Internal Medicine

## 2016-04-13 ENCOUNTER — Ambulatory Visit (INDEPENDENT_AMBULATORY_CARE_PROVIDER_SITE_OTHER): Payer: BLUE CROSS/BLUE SHIELD | Admitting: Internal Medicine

## 2016-04-13 VITALS — BP 100/74 | HR 77 | Temp 97.7°F | Ht 66.0 in | Wt 259.0 lb

## 2016-04-13 DIAGNOSIS — R1012 Left upper quadrant pain: Secondary | ICD-10-CM | POA: Diagnosis not present

## 2016-04-13 DIAGNOSIS — M545 Low back pain, unspecified: Secondary | ICD-10-CM

## 2016-04-13 DIAGNOSIS — R109 Unspecified abdominal pain: Secondary | ICD-10-CM

## 2016-04-13 LAB — POCT URINALYSIS DIP (MANUAL ENTRY)
BILIRUBIN UA: NEGATIVE
Bilirubin, UA: NEGATIVE
Blood, UA: NEGATIVE
LEUKOCYTES UA: NEGATIVE
Nitrite, UA: NEGATIVE
PROTEIN UA: NEGATIVE
SPEC GRAV UA: 1.02
Urobilinogen, UA: 1
pH, UA: 5.5

## 2016-04-13 MED ORDER — CYCLOBENZAPRINE HCL 5 MG PO TABS: 5.0000 mg | ORAL_TABLET | Freq: Three times a day (TID) | ORAL | 1 refills | Status: DC | PRN

## 2016-04-13 MED ORDER — TRAMADOL HCL 50 MG PO TABS: 50.0000 mg | ORAL_TABLET | Freq: Four times a day (QID) | ORAL | 0 refills | Status: DC | PRN

## 2016-04-13 NOTE — Patient Instructions (Signed)
You  may move around, but avoid painful motions and activities.    Take Aleve 200 mg twice daily for pain or swelling  Call or return to clinic prn if these symptoms worsen or fail to improve as anticipated.

## 2016-04-13 NOTE — Progress Notes (Signed)
Subjective:    Patient ID: Tammy Delgado, female    DOB: 1970-11-22, 45 y.o.   MRN: [id number]  HPI    45 year old patient who presents with a three-day history of left flank pain.  She spent [place] in in [place] and did considerable amount of walking.  Pain is in the left posterior flank area and aggravated by movement.  She describes the pain as sharp and fairly constant. No chest pain or shortness of breath.  She does have diabetes and has not been seen in over one year.  She does state that glycemic control has been good with a fasting blood sugar today of 162 and a random blood sugar yesterday afternoon of 108.  She has had no history of kidney stones.  No fever or other constitutional complaints  Past Medical History:  Diagnosis Date  . Diabetes mellitus without complication [facility](HCC)      Social History   Social History  . Marital status: Married    Spouse name: N/A  . Number of children: N/A  . Years of education: N/A   Occupational History  . Not on file.   Social History Main Topics  . Smoking status: Never Smoker  . Smokeless tobacco: Never Used  . Alcohol use No  . Drug use: No  . Sexual activity: Not on file   Other Topics Concern  . Not on file   Social History Narrative  . No narrative on file    Past Surgical History:  Procedure Laterality Date  . CESAREAN SECTION  1995 and 2007    Family History  Problem Relation Age of Onset  . Diabetes Mother   . Hypertension Mother   . Hypertension Maternal Grandmother   . Diabetes Maternal Grandmother     Allergies  Allergen Reactions  . Chicken Allergy     Current Outpatient Prescriptions on File Prior to Visit  Medication Sig Dispense Refill  . BD PEN NEEDLE NANO U/F 32G X 4 MM MISC USE AS DIRECTED 100 each 5  . metFORMIN (GLUCOPHAGE) 1000 MG tablet Take 1 tablet (1,000 mg total) by mouth 2 (two) times daily with a meal. 180 tablet 3  . NOVOLOG MIX 70/30 FLEXPEN (70-30) 100 UNIT/ML  FlexPen USE AS DIRECTED 45 mL 0  . phentermine 37.5 MG capsule Take 1 capsule (37.5 mg total) by mouth every morning. 90 capsule 1   No current facility-administered medications on file prior to visit.     BP 100/74 (BP Location: Left Arm, Patient Position: Sitting, Cuff Size: Large)   Pulse 77   Temp 97.7 F (36.5 C) (Oral)   Ht 5\' 6"  (1.676 m)   Wt 259 lb (117.5 kg)   LMP 03/30/2016   SpO2 98%   BMI 41.80 kg/m     Review of Systems  Constitutional: Negative.   Endocrine: Negative for polyuria.  Genitourinary: Positive for flank pain. Negative for difficulty urinating, dysuria, frequency, hematuria and urgency.       Objective:   Physical Exam  Constitutional: She is oriented to person, place, and time. She appears well-developed and well-nourished.  Afebrile No distress  HENT:  Head: Normocephalic.  Right Ear: External ear normal.  Left Ear: External ear normal.  Mouth/Throat: Oropharynx is clear and moist.  Eyes: Conjunctivae and EOM are normal. Pupils are equal, round, and reactive to light.  Neck: Normal range of motion. Neck supple. No thyromegaly present.  Cardiovascular: Normal rate, regular rhythm, normal heart sounds and intact  distal pulses.   Pulmonary/Chest: Effort normal and breath sounds normal.  Abdominal: Soft. Bowel sounds are normal. She exhibits no mass. There is no tenderness.  Superficial tenderness in the left posterior flank area GI examination normal No left upper quadrant tenderness  Bowel sounds normal  Musculoskeletal: Normal range of motion.  Lymphadenopathy:    She has no cervical adenopathy.  Neurological: She is alert and oriented to person, place, and time.  Skin: Skin is warm and dry. No rash noted.  Psychiatric: She has a normal mood and affect. Her behavior is normal.          Assessment & Plan:   Left flank pain.  Appears to be musculoligamentous.  Will continue Aleve that was started yesterday.  Pain is somewhat  improved today.  Will treat with short-term tramadol and Flexeril.  She will report any new or worsening symptoms  Diabetes mellitus.  The patient has been asked to schedule a follow-up visit with her provider within the next 3 or 4 weeks  Rogelia Boga, MD

## 2016-08-17 ENCOUNTER — Ambulatory Visit (INDEPENDENT_AMBULATORY_CARE_PROVIDER_SITE_OTHER): Payer: BLUE CROSS/BLUE SHIELD | Admitting: Family Medicine

## 2016-08-17 ENCOUNTER — Encounter: Payer: Self-pay | Admitting: Family Medicine

## 2016-08-17 VITALS — BP 130/78 | Temp 98.8°F | Ht 66.0 in | Wt 249.0 lb

## 2016-08-17 DIAGNOSIS — J019 Acute sinusitis, unspecified: Secondary | ICD-10-CM | POA: Diagnosis not present

## 2016-08-17 DIAGNOSIS — Z794 Long term (current) use of insulin: Secondary | ICD-10-CM | POA: Diagnosis not present

## 2016-08-17 DIAGNOSIS — B351 Tinea unguium: Secondary | ICD-10-CM

## 2016-08-17 DIAGNOSIS — E119 Type 2 diabetes mellitus without complications: Secondary | ICD-10-CM | POA: Diagnosis not present

## 2016-08-17 LAB — CBC WITH DIFFERENTIAL/PLATELET
BASOS ABS: 0.1 10*3/uL (ref 0.0–0.1)
Basophils Relative: 0.6 % (ref 0.0–3.0)
EOS PCT: 3.5 % (ref 0.0–5.0)
Eosinophils Absolute: 0.3 10*3/uL (ref 0.0–0.7)
HCT: 38.1 % (ref 36.0–46.0)
HEMOGLOBIN: 12.8 g/dL (ref 12.0–15.0)
LYMPHS ABS: 3 10*3/uL (ref 0.7–4.0)
Lymphocytes Relative: 37.5 % (ref 12.0–46.0)
MCHC: 33.5 g/dL (ref 30.0–36.0)
MCV: 86.9 fl (ref 78.0–100.0)
MONO ABS: 0.9 10*3/uL (ref 0.1–1.0)
MONOS PCT: 11.6 % (ref 3.0–12.0)
NEUTROS PCT: 46.8 % (ref 43.0–77.0)
Neutro Abs: 3.8 10*3/uL (ref 1.4–7.7)
Platelets: 330 10*3/uL (ref 150.0–400.0)
RBC: 4.39 Mil/uL (ref 3.87–5.11)
RDW: 14 % (ref 11.5–15.5)
WBC: 8.1 10*3/uL (ref 4.0–10.5)

## 2016-08-17 LAB — BASIC METABOLIC PANEL
BUN: 9 mg/dL (ref 6–23)
CALCIUM: 9.8 mg/dL (ref 8.4–10.5)
CO2: 33 mEq/L — ABNORMAL HIGH (ref 19–32)
Chloride: 99 mEq/L (ref 96–112)
Creatinine, Ser: 0.86 mg/dL (ref 0.40–1.20)
GFR: 91.75 mL/min (ref >60.00)
GLUCOSE: 201 mg/dL — AB (ref 70–99)
POTASSIUM: 4.2 meq/L (ref 3.5–5.1)
SODIUM: 139 meq/L (ref 135–145)

## 2016-08-17 LAB — HEMOGLOBIN A1C: Hgb A1c MFr Bld: 10.6 % — ABNORMAL HIGH (ref 4.6–6.5)

## 2016-08-17 MED ORDER — AMOXICILLIN-POT CLAVULANATE 875-125 MG PO TABS: 1.0000 | ORAL_TABLET | Freq: Two times a day (BID) | ORAL | 0 refills | Status: DC

## 2016-08-17 MED ORDER — PHENTERMINE HCL 37.5 MG PO CAPS: 37.5000 mg | ORAL_CAPSULE | ORAL | 1 refills | Status: DC

## 2016-08-17 MED ORDER — INSULIN PEN NEEDLE 32G X 4 MM MISC: 5 refills | Status: DC

## 2016-08-17 MED ORDER — METFORMIN HCL 1000 MG PO TABS: 1000.0000 mg | ORAL_TABLET | Freq: Two times a day (BID) | ORAL | 3 refills | Status: DC

## 2016-08-17 MED ORDER — HYDROCODONE-HOMATROPINE 5-1.5 MG/5ML PO SYRP: 5.0000 mL | ORAL_SOLUTION | ORAL | 0 refills | Status: DC | PRN

## 2016-08-17 MED ORDER — INSULIN ASPART PROT & ASPART (70-30 MIX) 100 UNIT/ML PEN: 45.0000 [IU] | PEN_INJECTOR | SUBCUTANEOUS | 5 refills | Status: DC | PRN

## 2016-08-17 NOTE — Progress Notes (Signed)
   Subjective:    Patient ID: Tammy Delgado, female    DOB: Jan 27, 1971, 45 y.o.   MRN: [id number]  HPI Here for 5 days of sinus congestion, PND, STR, and coughing up green sputum. Also she had fungus infections in most of her toenails and she wants to see a Podiatrist to remove one of the nails. Her diabetes has not been well controlled lately. Her fasting glucoses have been in the range of 180-220.   Review of Systems  Constitutional: Negative.   HENT: Positive for congestion, postnasal drip, sinus pain, sinus pressure and sore throat. Negative for ear pain.   Eyes: Negative.   Respiratory: Positive for cough.   Cardiovascular: Negative.        Objective:   Physical Exam  Constitutional: She appears well-developed and well-nourished.  HENT:  Right Ear: External ear normal.  Left Ear: External ear normal.  Nose: Nose normal.  Mouth/Throat: Oropharynx is clear and moist.  Eyes: Conjunctivae are normal.  Neck: No thyromegaly present.  Pulmonary/Chest: Effort normal and breath sounds normal.  Lymphadenopathy:    She has no cervical adenopathy.  Skin:  Fungal involvement in most of the toenails           Assessment & Plan:  Treat the sinusitis with Augmentin. Refer to Podiatry for the toenails. Get labs today including an A1c to gauge the diabetes.  Nelwyn SalisburyFRY,Waldo Damian A, MD

## 2016-08-17 NOTE — Progress Notes (Signed)
Pre visit review using our clinic review tool, if applicable. No additional management support is needed unless otherwise documented below in the visit note. 

## 2016-08-25 ENCOUNTER — Telehealth: Payer: Self-pay | Admitting: Family Medicine

## 2016-08-25 NOTE — Telephone Encounter (Signed)
We cannot refill the narcotic cough syrup but to clear up the drainage I suggest Delsym

## 2016-08-25 NOTE — Telephone Encounter (Addendum)
Pt would like to try Cheratussin and z pak . [facility] [address]

## 2016-08-25 NOTE — Telephone Encounter (Signed)
Pt seen 12/12 and still has a cough, runny nose, and congestion. Would like a refill of the cough medicine, and or something else that would help clear this up.  [facility] / [address]

## 2016-08-26 MED ORDER — METHYLPREDNISOLONE 4 MG PO TBPK: ORAL_TABLET | ORAL | 0 refills | Status: DC

## 2016-08-26 NOTE — Telephone Encounter (Signed)
Call in a Medrol dose pack  

## 2016-08-26 NOTE — Telephone Encounter (Signed)
[facility]

## 2016-08-26 NOTE — Telephone Encounter (Signed)
Pt is still coughing bad, taking the Augmentin and has tried over the counter cough medication and nothing has helped. What else can you recommend?

## 2016-09-13 ENCOUNTER — Ambulatory Visit: Payer: BLUE CROSS/BLUE SHIELD | Admitting: Podiatry

## 2017-08-23 ENCOUNTER — Other Ambulatory Visit: Payer: Self-pay | Admitting: Family Medicine

## 2017-08-24 ENCOUNTER — Encounter: Payer: Self-pay | Admitting: Family Medicine

## 2017-08-24 NOTE — Telephone Encounter (Signed)
Refill for 30 days. She needs an OV soon

## 2017-08-24 NOTE — Telephone Encounter (Signed)
Sent to PCP for approval. Pt hasn't been seen since 08/17/2016.

## 2017-09-26 ENCOUNTER — Other Ambulatory Visit: Payer: Self-pay | Admitting: Family Medicine

## 2017-09-26 MED ORDER — INSULIN ASPART PROT & ASPART (70-30 MIX) 100 UNIT/ML PEN: PEN_INJECTOR | SUBCUTANEOUS | 0 refills | Status: DC

## 2017-09-26 NOTE — Telephone Encounter (Signed)
Call in a 30 day supply  

## 2017-09-26 NOTE — Telephone Encounter (Signed)
Called and spoke with pt. Pt advised that this has been sent.

## 2017-09-26 NOTE — Telephone Encounter (Signed)
Pt husband called to ask about rx. He is at pharmacy now. Rx sent per approval. Pt scheduled for OV 09/27/17.

## 2017-09-26 NOTE — Addendum Note (Signed)
Addended by: Gracelyn NurseBLACKWELL, SHELBY P on: 09/26/2017 05:07 PM   Modules accepted: Orders

## 2017-09-26 NOTE — Telephone Encounter (Signed)
Sent to PCP for approval.  

## 2017-09-26 NOTE — Telephone Encounter (Signed)
Patient has not been seen since 2017 but is on the schedule for tomorrow at 0800 with PCP. Patient is aware that she must follow up with PCP due to time that has past since last visit. Patient request a small amount of below medication to get her by due to her being out of medication today.    NOVOLOG MIX 70/30 FLEXPEN (70-30) 100 UNIT/ML FlexPen

## 2017-09-27 ENCOUNTER — Encounter: Payer: Self-pay | Admitting: Family Medicine

## 2017-09-27 ENCOUNTER — Ambulatory Visit (INDEPENDENT_AMBULATORY_CARE_PROVIDER_SITE_OTHER): Payer: Self-pay | Admitting: Family Medicine

## 2017-09-27 VITALS — BP 122/80 | HR 85 | Temp 98.1°F | Wt 264.8 lb

## 2017-09-27 DIAGNOSIS — Z794 Long term (current) use of insulin: Secondary | ICD-10-CM

## 2017-09-27 DIAGNOSIS — E119 Type 2 diabetes mellitus without complications: Secondary | ICD-10-CM

## 2017-09-27 DIAGNOSIS — J209 Acute bronchitis, unspecified: Secondary | ICD-10-CM

## 2017-09-27 LAB — CBC WITH DIFFERENTIAL/PLATELET
BASOS ABS: 0.1 10*3/uL (ref 0.0–0.1)
Basophils Relative: 0.9 % (ref 0.0–3.0)
EOS PCT: 2.2 % (ref 0.0–5.0)
Eosinophils Absolute: 0.2 10*3/uL (ref 0.0–0.7)
HCT: 35.6 % — ABNORMAL LOW (ref 36.0–46.0)
HEMOGLOBIN: 12.3 g/dL (ref 12.0–15.0)
Lymphocytes Relative: 36.8 % (ref 12.0–46.0)
Lymphs Abs: 2.5 10*3/uL (ref 0.7–4.0)
MCHC: 34.6 g/dL (ref 30.0–36.0)
MCV: 88.9 fl (ref 78.0–100.0)
MONO ABS: 0.4 10*3/uL (ref 0.1–1.0)
MONOS PCT: 5.9 % (ref 3.0–12.0)
Neutro Abs: 3.6 10*3/uL (ref 1.4–7.7)
Neutrophils Relative %: 54.2 % (ref 43.0–77.0)
Platelets: 270 10*3/uL (ref 150.0–400.0)
RBC: 4.01 Mil/uL (ref 3.87–5.11)
RDW: 13.9 % (ref 11.5–15.5)
WBC: 6.7 10*3/uL (ref 4.0–10.5)

## 2017-09-27 LAB — BASIC METABOLIC PANEL
BUN: 14 mg/dL (ref 6–23)
CO2: 31 mEq/L (ref 19–32)
CREATININE: 0.78 mg/dL (ref 0.40–1.20)
Calcium: 9 mg/dL (ref 8.4–10.5)
Chloride: 100 mEq/L (ref 96–112)
GFR: 102.19 mL/min (ref >60.00)
Glucose, Bld: 319 mg/dL — ABNORMAL HIGH (ref 70–99)
Potassium: 3.7 mEq/L (ref 3.5–5.1)
Sodium: 137 mEq/L (ref 135–145)

## 2017-09-27 LAB — HEPATIC FUNCTION PANEL
ALBUMIN: 3.8 g/dL (ref 3.5–5.2)
ALT: 12 U/L (ref 0–35)
AST: 12 U/L (ref 0–37)
Alkaline Phosphatase: 93 U/L (ref 39–117)
Bilirubin, Direct: 0.2 mg/dL (ref 0.0–0.3)
TOTAL PROTEIN: 6.7 g/dL (ref 6.0–8.3)
Total Bilirubin: 0.9 mg/dL (ref 0.2–1.2)

## 2017-09-27 LAB — HEMOGLOBIN A1C: Hgb A1c MFr Bld: 10.5 % — ABNORMAL HIGH (ref 4.6–6.5)

## 2017-09-27 LAB — TSH: TSH: 2.27 u[IU]/mL (ref 0.35–4.50)

## 2017-09-27 LAB — LIPID PANEL
Cholesterol: 163 mg/dL (ref 0–200)
HDL: 39.1 mg/dL (ref >39.00)
LDL CALC: 108 mg/dL — AB (ref 0–99)
NonHDL: 123.45
TRIGLYCERIDES: 75 mg/dL (ref 0.0–149.0)
Total CHOL/HDL Ratio: 4
VLDL: 15 mg/dL (ref 0.0–40.0)

## 2017-09-27 LAB — MICROALBUMIN / CREATININE URINE RATIO
CREATININE, U: 169.8 mg/dL
MICROALB/CREAT RATIO: 0.4 mg/g (ref 0.0–30.0)
Microalb, Ur: <0.7 mg/dL (ref 0.0–1.9)

## 2017-09-27 MED ORDER — INSULIN ASPART PROT & ASPART (70-30 MIX) 100 UNIT/ML PEN: PEN_INJECTOR | SUBCUTANEOUS | 11 refills | Status: DC

## 2017-09-27 MED ORDER — METFORMIN HCL 1000 MG PO TABS: 1000.0000 mg | ORAL_TABLET | Freq: Two times a day (BID) | ORAL | Status: DC

## 2017-09-27 MED ORDER — AZITHROMYCIN 250 MG PO TABS: ORAL_TABLET | ORAL | 0 refills | Status: DC

## 2017-09-27 MED ORDER — TRAMADOL HCL 50 MG PO TABS: 50.0000 mg | ORAL_TABLET | Freq: Four times a day (QID) | ORAL | 5 refills | Status: DC | PRN

## 2017-09-27 MED ORDER — INSULIN PEN NEEDLE 32G X 4 MM MISC
5 refills | Status: DC
Start: 2017-09-27 — End: 2018-12-21

## 2017-09-27 MED ORDER — CYCLOBENZAPRINE HCL 5 MG PO TABS: 5.0000 mg | ORAL_TABLET | Freq: Three times a day (TID) | ORAL | 5 refills | Status: DC | PRN

## 2017-09-27 MED ORDER — PHENTERMINE HCL 37.5 MG PO CAPS: 37.5000 mg | ORAL_CAPSULE | ORAL | 5 refills | Status: DC

## 2017-09-27 NOTE — Progress Notes (Signed)
   Subjective:    Patient ID: Tammy Delgado, female    DOB: Jun 19, 1971, 47 y.o.   MRN: [id number]  HPI Here to follow up on diabetes. We last saw her in December 2017 when her A1c was 10.6. She admits to taking poor care of herself this past year, and she explains that both of her parents have had serious health problems and they live 4 hours away. She has spent a lot of time travelling back and forth to them, and she has young children here to take care of as well. She has been getting am fasting glucoses in the range of 160-240 most of the time. She also notes that a week ago she developed chest congestion and is coughing up yellow sputum. No fever.   Review of Systems  Constitutional: Negative.   HENT: Positive for congestion and postnasal drip. Negative for sinus pressure, sinus pain and sore throat.   Eyes: Negative.   Respiratory: Positive for cough and chest tightness.   Cardiovascular: Negative.        Objective:   Physical Exam  Constitutional: She is oriented to person, place, and time. She appears well-developed and well-nourished.  HENT:  Right Ear: External ear normal.  Left Ear: External ear normal.  Nose: Nose normal.  Mouth/Throat: Oropharynx is clear and moist.  Eyes: Conjunctivae are normal.  Neck: No thyromegaly present.  Cardiovascular: Normal rate, regular rhythm, normal heart sounds and intact distal pulses.  Pulmonary/Chest: Effort normal and breath sounds normal. No respiratory distress. She has no wheezes. She has no rales.  Lymphadenopathy:    She has no cervical adenopathy.  Neurological: She is alert and oriented to person, place, and time.          Assessment & Plan:  She has a bronchitis and we will treat this with a Zpack. She is fasting today so we will get labs including an A1c. We will increase the dosing of her Novolog mix to 25 units in the am and 35 units in the pm. She plans to rejoin [employer] and pay more attention to her health  this year.  Gershon CraneStephen Levis Nazir, MD

## 2018-05-09 ENCOUNTER — Encounter (INDEPENDENT_AMBULATORY_CARE_PROVIDER_SITE_OTHER): Payer: Self-pay

## 2018-05-18 ENCOUNTER — Encounter (INDEPENDENT_AMBULATORY_CARE_PROVIDER_SITE_OTHER): Payer: Self-pay | Admitting: Bariatrics

## 2018-05-18 ENCOUNTER — Ambulatory Visit (INDEPENDENT_AMBULATORY_CARE_PROVIDER_SITE_OTHER): Payer: BLUE CROSS/BLUE SHIELD | Admitting: Bariatrics

## 2018-05-18 ENCOUNTER — Ambulatory Visit (INDEPENDENT_AMBULATORY_CARE_PROVIDER_SITE_OTHER): Payer: PRIVATE HEALTH INSURANCE | Admitting: Bariatrics

## 2018-05-18 ENCOUNTER — Other Ambulatory Visit (INDEPENDENT_AMBULATORY_CARE_PROVIDER_SITE_OTHER): Payer: Self-pay | Admitting: Bariatrics

## 2018-05-18 VITALS — BP 116/75 | HR 72 | Temp 97.8°F | Ht 65.0 in | Wt 258.0 lb

## 2018-05-18 DIAGNOSIS — Z794 Long term (current) use of insulin: Secondary | ICD-10-CM

## 2018-05-18 DIAGNOSIS — R0602 Shortness of breath: Secondary | ICD-10-CM | POA: Diagnosis not present

## 2018-05-18 DIAGNOSIS — R5383 Other fatigue: Secondary | ICD-10-CM | POA: Diagnosis not present

## 2018-05-18 DIAGNOSIS — Z6841 Body Mass Index (BMI) 40.0 and over, adult: Secondary | ICD-10-CM

## 2018-05-18 DIAGNOSIS — Z9189 Other specified personal risk factors, not elsewhere classified: Secondary | ICD-10-CM

## 2018-05-18 DIAGNOSIS — E119 Type 2 diabetes mellitus without complications: Secondary | ICD-10-CM | POA: Diagnosis not present

## 2018-05-18 DIAGNOSIS — Z1331 Encounter for screening for depression: Secondary | ICD-10-CM

## 2018-05-18 DIAGNOSIS — Z0289 Encounter for other administrative examinations: Secondary | ICD-10-CM

## 2018-05-19 LAB — COMPREHENSIVE METABOLIC PANEL
A/G RATIO: 1.3 (ref 1.2–2.2)
ALT: 10 IU/L (ref 0–32)
AST: 14 IU/L (ref 0–40)
Albumin: 4.1 g/dL (ref 3.5–5.5)
Alkaline Phosphatase: 107 IU/L (ref 39–117)
BUN/Creatinine Ratio: 14 (ref 9–23)
BUN: 11 mg/dL (ref 6–24)
Bilirubin Total: 0.7 mg/dL (ref 0.0–1.2)
CALCIUM: 9.1 mg/dL (ref 8.7–10.2)
CO2: 25 mmol/L (ref 20–29)
Chloride: 98 mmol/L (ref 96–106)
Creatinine, Ser: 0.8 mg/dL (ref 0.57–1.00)
GFR, EST AFRICAN AMERICAN: 102 mL/min/{1.73_m2} (ref >59)
GFR, EST NON AFRICAN AMERICAN: 89 mL/min/{1.73_m2} (ref >59)
GLOBULIN, TOTAL: 3.2 g/dL (ref 1.5–4.5)
Glucose: 260 mg/dL — ABNORMAL HIGH (ref 65–99)
POTASSIUM: 4.3 mmol/L (ref 3.5–5.2)
SODIUM: 139 mmol/L (ref 134–144)
TOTAL PROTEIN: 7.3 g/dL (ref 6.0–8.5)

## 2018-05-19 LAB — CBC WITH DIFFERENTIAL
BASOS: 1 %
Basophils Absolute: 0.1 10*3/uL (ref 0.0–0.2)
EOS (ABSOLUTE): 0.2 10*3/uL (ref 0.0–0.4)
EOS: 3 %
HEMATOCRIT: 37.6 % (ref 34.0–46.6)
Hemoglobin: 12.6 g/dL (ref 11.1–15.9)
Immature Grans (Abs): 0 10*3/uL (ref 0.0–0.1)
Immature Granulocytes: 0 %
Lymphocytes Absolute: 2.9 10*3/uL (ref 0.7–3.1)
Lymphs: 43 %
MCH: 30.4 pg (ref 26.6–33.0)
MCHC: 33.5 g/dL (ref 31.5–35.7)
MCV: 91 fL (ref 79–97)
MONOCYTES: 6 %
Monocytes Absolute: 0.4 10*3/uL (ref 0.1–0.9)
Neutrophils Absolute: 3.2 10*3/uL (ref 1.4–7.0)
Neutrophils: 47 %
RBC: 4.15 x10E6/uL (ref 3.77–5.28)
RDW: 14.1 % (ref 12.3–15.4)
WBC: 6.8 10*3/uL (ref 3.4–10.8)

## 2018-05-19 LAB — LIPID PANEL WITH LDL/HDL RATIO
CHOLESTEROL TOTAL: 169 mg/dL (ref 100–199)
HDL: 45 mg/dL (ref >39)
LDL Calculated: 111 mg/dL — ABNORMAL HIGH (ref 0–99)
LDl/HDL Ratio: 2.5 ratio (ref 0.0–3.2)
Triglycerides: 65 mg/dL (ref 0–149)
VLDL CHOLESTEROL CAL: 13 mg/dL (ref 5–40)

## 2018-05-19 LAB — MICROALBUMIN / CREATININE URINE RATIO
Creatinine, Urine: 144.4 mg/dL
Microalb/Creat Ratio: 2.5 mg/g creat (ref 0.0–30.0)
Microalbumin, Urine: 3.6 ug/mL

## 2018-05-19 LAB — VITAMIN B12: Vitamin B-12: 502 pg/mL (ref 232–1245)

## 2018-05-19 LAB — HEMOGLOBIN A1C
Est. average glucose Bld gHb Est-mCnc: 229 mg/dL
Hgb A1c MFr Bld: 9.6 % — ABNORMAL HIGH (ref 4.8–5.6)

## 2018-05-19 LAB — C-PEPTIDE: C PEPTIDE: 1.3 ng/mL (ref 1.1–4.4)

## 2018-05-19 LAB — TSH: TSH: 1.73 u[IU]/mL (ref 0.450–4.500)

## 2018-05-19 LAB — VITAMIN D 25 HYDROXY (VIT D DEFICIENCY, FRACTURES): Vit D, 25-Hydroxy: 9.7 ng/mL — ABNORMAL LOW (ref 30.0–100.0)

## 2018-05-19 LAB — T3: T3 TOTAL: 103 ng/dL (ref 71–180)

## 2018-05-19 LAB — T4, FREE: Free T4: 1.24 ng/dL (ref 0.82–1.77)

## 2018-05-23 NOTE — Progress Notes (Signed)
Office: [phone]  /  Fax: [phone]   Dear Dr. Clent Ridges,   Thank you for referring CECELY RENGEL to our clinic. The following note includes my evaluation and treatment recommendations.  HPI:   Chief Complaint: OBESITY    Chasity S Colcord has been referred by Tera Mater. Clent Ridges, MD for consultation regarding her obesity and obesity related comorbidities.    Tammy Delgado (MR# [id number]) is a 47 y.o. female who presents on 05/23/2018 for obesity evaluation and treatment. Current BMI is Body mass index is 42.93 kg/m.Marland Kitchen Alyce has been struggling with her weight for many years and has been unsuccessful in either losing weight, maintaining weight loss, or reaching her healthy weight goal.     Namya attended our information session and states she is currently in the action stage of change and ready to dedicate time achieving and maintaining a healthier weight. Willodene is interested in becoming our patient and working on intensive lifestyle modifications including (but not limited to) diet, exercise and weight loss.    Cyrstal states her family eats meals together she thinks her family will eat healthier with  her her desired weight loss is 105 lbs she started gaining weight in college her heaviest weight ever was 255 lbs. she is a picky eater and doesn't like to eat healthier foods  she skips meals frequently she is frequently drinking liquids with calories she frequently makes poor food choices she struggles with emotional eating    Fatigue Cera feels her energy is lower than it should be. This has worsened with weight gain and has not worsened recently. Jaselle admits to daytime somnolence and admits to waking up still tired. Patient is at risk for obstructive sleep apnea. Patent has a history of symptoms of daytime fatigue and morning fatigue. Patient generally gets 6 or 7 hours of sleep per night, and states they generally have restless sleep. Snoring is not present. Apneic episodes  are not present. Epworth Sleepiness Score is 7 Omah is under stress, and she is working irregular work hours, but she is sleeping okay.   Dyspnea on exertion Madylin notes increasing shortness of breath with exercising and seems to be worsening over time with weight gain. She notes getting out of breath sooner with activity than she used to. This has not gotten worse recently. Braylon denies orthopnea.  Diabetes II Avigayil has a diagnosis of diabetes type II. Jaylani is taking Novolog 70/30 and states that she is supposed to be taking metformin. Jude  denies any hypoglycemic episodes. Last A1c was at 10.5  She has been working on intensive lifestyle modifications including diet, exercise, and weight loss to help control her blood glucose levels.  At risk for cardiovascular disease Ivori is at a higher than average risk for cardiovascular disease due to obesity and diabetes. She currently denies any chest pain.  Depression Screen Martrice's Food and Mood (modified PHQ-9) score was  Depression screen PHQ 2/9 05/18/2018  Decreased Interest 1  Down, Depressed, Hopeless 0  PHQ - 2 Score 1  Altered sleeping 0  Tired, decreased energy 2  Change in appetite 1  Feeling bad or failure about yourself  0  Trouble concentrating 0  Moving slowly or fidgety/restless 0  Suicidal thoughts 0  PHQ-9 Score 4  Difficult doing work/chores Not difficult at all    ALLERGIES: Allergies  Allergen Reactions  . Chicken Allergy     MEDICATIONS: Current Outpatient Medications on File Prior to Visit  Medication Sig Dispense Refill  .  insulin aspart protamine - aspart (NOVOLOG MIX 70/30 FLEXPEN) (70-30) 100 UNIT/ML FlexPen INJECT 25 units in the am and 35 units in the pm 5 pen 11  . Insulin Pen Needle (BD PEN NEEDLE NANO U/F) 32G X 4 MM MISC USE AS DIRECTED 100 each 5  . metFORMIN (GLUCOPHAGE) 1000 MG tablet Take 1 tablet (1,000 mg total) by mouth 2 (two) times daily with a meal. 60 tablet 311   No current  facility-administered medications on file prior to visit.     PAST MEDICAL HISTORY: Past Medical History:  Diagnosis Date  . Anemia   . Diabetes mellitus without complication (HCC)     PAST SURGICAL HISTORY: Past Surgical History:  Procedure Laterality Date  . CESAREAN SECTION  1995 and 2007    SOCIAL HISTORY: Social History   Tobacco Use  . Smoking status: Never Smoker  . Smokeless tobacco: Never Used  Substance Use Topics  . Alcohol use: No  . Drug use: No    FAMILY HISTORY: Family History  Problem Relation Age of Onset  . Diabetes Mother   . Hypertension Mother   . Cancer Mother   . Hypertension Maternal Grandmother   . Diabetes Maternal Grandmother   . Hypertension Father   . Cancer Father     ROS: Review of Systems  Constitutional: Positive for malaise/fatigue.  Eyes:       Wear Glasses or Contacts  Respiratory: Positive for shortness of breath (on exertion).   Cardiovascular: Negative for chest pain and orthopnea.  Endo/Heme/Allergies:       Negative for hypoglycemia    PHYSICAL EXAM: Blood pressure 116/75, pulse 72, temperature 97.8 F (36.6 C), temperature source Oral, height 5\' 5"  (1.651 m), weight 258 lb (117 kg), last menstrual period 04/19/2018, SpO2 99 %. Body mass index is 42.93 kg/m. Physical Exam  Constitutional: She is oriented to person, place, and time. She appears well-developed and well-nourished.  HENT:  Head: Normocephalic and atraumatic.  Nose: Nose normal.  Eyes: EOM are normal. No scleral icterus.  Neck: Normal range of motion. Neck supple. No thyromegaly present.  Cardiovascular: Normal rate and regular rhythm.  Pulmonary/Chest: Effort normal. No respiratory distress.  Abdominal: Soft. There is no tenderness.  + obesity  Musculoskeletal: Normal range of motion.  Range of Motion normal in all 4 extremities  Neurological: She is alert and oriented to person, place, and time. Coordination normal.  Skin: Skin is warm and dry.   Psychiatric: She has a normal mood and affect. Her behavior is normal.  Vitals reviewed.   RECENT LABS AND TESTS: BMET    Component Value Date/Time   NA 139 05/18/2018 1001   K 4.3 05/18/2018 1001   CL 98 05/18/2018 1001   CO2 25 05/18/2018 1001   GLUCOSE 260 (H) 05/18/2018 1001   GLUCOSE 319 (H) 09/27/2017 0841   BUN 11 05/18/2018 1001   CREATININE 0.80 05/18/2018 1001   CALCIUM 9.1 05/18/2018 1001   GFRNONAA 89 05/18/2018 1001   GFRAA 102 05/18/2018 1001   Lab Results  Component Value Date   HGBA1C 9.6 (H) 05/18/2018   No results found for: INSULIN CBC    Component Value Date/Time   WBC 6.8 05/18/2018 1001   WBC 6.7 09/27/2017 0841   RBC 4.15 05/18/2018 1001   RBC 4.01 09/27/2017 0841   HGB 12.6 05/18/2018 1001   HCT 37.6 05/18/2018 1001   PLT 270.0 09/27/2017 0841   MCV 91 05/18/2018 1001   MCH 30.4 05/18/2018 1001  MCH 29.9 01/07/2013 0809   MCHC 33.5 05/18/2018 1001   MCHC 34.6 09/27/2017 0841   RDW 14.1 05/18/2018 1001   LYMPHSABS 2.9 05/18/2018 1001   MONOABS 0.4 09/27/2017 0841   EOSABS 0.2 05/18/2018 1001   BASOSABS 0.1 05/18/2018 1001   Iron/TIBC/Ferritin/ %Sat No results found for: IRON, TIBC, FERRITIN, IRONPCTSAT Lipid Panel     Component Value Date/Time   CHOL 169 05/18/2018 1001   TRIG 65 05/18/2018 1001   HDL 45 05/18/2018 1001   CHOLHDL 4 09/27/2017 0841   VLDL 15.0 09/27/2017 0841   LDLCALC 111 (H) 05/18/2018 1001   Hepatic Function Panel     Component Value Date/Time   PROT 7.3 05/18/2018 1001   ALBUMIN 4.1 05/18/2018 1001   AST 14 05/18/2018 1001   ALT 10 05/18/2018 1001   ALKPHOS 107 05/18/2018 1001   BILITOT 0.7 05/18/2018 1001   BILIDIR 0.2 09/27/2017 0841      Component Value Date/Time   TSH 1.730 05/18/2018 1001   TSH 2.27 09/27/2017 0841   TSH 2.94 03/04/2015 0825    ECG  shows NSR with a rate of 78 BPM INDIRECT CALORIMETER done today shows a VO2 of 248 and a REE of 1723.  Her calculated basal metabolic rate is  1866 thus her basal metabolic rate is worse than expected.    ASSESSMENT AND PLAN: Other fatigue - Plan: EKG 12-Lead, Vitamin B12, CBC With Differential, Lipid Panel With LDL/HDL Ratio, VITAMIN D 25 Hydroxy (Vit-D Deficiency, Fractures), T3, T4, free, TSH  Shortness of breath on exertion - Plan: CBC With Differential  Type 2 diabetes mellitus without complication, with long-term current use of insulin (HCC) - Plan: Comprehensive metabolic panel, Hemoglobin A1c, C-peptide  Depression screening  At risk for osteoporosis  Class 3 severe obesity with serious comorbidity and body mass index (BMI) of 40.0 to 44.9 in adult, unspecified obesity type (HCC)  PLAN: Fatigue Tylasia was informed that her fatigue may be related to obesity, depression or many other causes. Labs will be ordered, and in the meanwhile Teonna has agreed to work on diet, exercise and weight loss to help with fatigue. We will add in exercise over time and she will work on increasing protein and H2O intake. Proper sleep hygiene was discussed including the need for 7-8 hours of quality sleep each night. A sleep study was not ordered based on symptoms and Epworth score.  Dyspnea on exertion Stanisha's shortness of breath appears to be obesity related and exercise induced. She has agreed to work on weight loss and gradually increase exercise to treat her exercise induced shortness of breath. If Tiearra follows our instructions and loses weight without improvement of her shortness of breath, we will plan to refer to pulmonology. We will monitor this condition regularly. Torrey agrees to this plan.  Diabetes II Ayo has been given extensive diabetes education by myself today including ideal fasting and post-prandial blood glucose readings, individual ideal Hgb A1c goals and hypoglycemia prevention. We discussed the importance of good blood sugar control to decrease the likelihood of diabetic complications such as nephropathy,  neuropathy, limb loss, blindness, coronary artery disease, and death. We discussed the importance of intensive lifestyle modification including diet, exercise and weight loss as the first line treatment for diabetes. We will do labs including Hgb A1c and C-Peptide. Elyanna will start keeping a log of her blood sugars (fasting and 2 hour post prandial).Jenisha will work on weight loss, exercise, and decreasing simple carbohydrates in her diet to  help decrease the risk of diabetes. We dicussed metformin including benefits and risks. She was informed that eating too many simple carbohydrates or too many calories at one sitting increases the likelihood of GI side effects. Chantea agrees to no more meal skipping. Kimie agrees to resume taking metformin and follow up as directed.  Cardiovascular risk counseling Tyffani was given extended (15 minutes) coronary artery disease prevention counseling today. She is 47 y.o. female and has risk factors for heart disease including obesity and diabetes. We discussed intensive lifestyle modifications today with an emphasis on specific weight loss instructions and strategies. Pt was also informed of the importance of increasing exercise and decreasing saturated fats to help prevent heart disease.  Depression Screen Suzannah had a negative depression screening. Depression is commonly associated with obesity and often results in emotional eating behaviors. We will monitor this closely and work on CBT to help improve the non-hunger eating patterns. Referral to Psychology may be required if no improvement is seen as she continues in our clinic.  Obesity Harnoor is currently in the action stage of change and her goal is to continue with weight loss efforts. I recommend Kelsy begin the structured treatment plan as follows:  She has agreed to follow the Category 2 plan We will add in exercise over time. We discussed the following Behavioral Modification Strategies today: no skipping  meals, increase H2O intake, increasing lean protein intake, decreasing simple carbohydrates, decreasing refined carbohydrates, increasing vegetables and decrease liquid calories.  Adie agrees to stop drinking sodas.   She was informed of the importance of frequent follow up visits to maximize her success with intensive lifestyle modifications for her multiple health conditions. She was informed we would discuss her lab results at her next visit unless there is a critical issue that needs to be addressed sooner. Rand agreed to keep her next visit at the agreed upon time to discuss these results.    OBESITY BEHAVIORAL INTERVENTION VISIT  Today's visit was # 1   Starting weight: 258 Starting date: 05/18/18 Today's weight : 258 lbs  Today's date: 05/18/2018 Total lbs lost to date: 0 At least 15 minutes were spent on discussing the following behavioral intervention visit.   ASK: We discussed the diagnosis of obesity with Raegen S Ritchie today and Cherise agreed to give us permission to discuss obesity behavioral modification therapy today.  ASSESS: Merrisa has the diagnosis of obesity and her BMI today is 42.93 Tereasa is in the action stage of change   ADVISE: Mathilda was educated on the multiple health risks of obesity as well as the benefit of weight loss to improve her health. She was advised of the need for long term treatment and the importance of lifestyle modifications to improve her current health and to decrease her risk of future health problems.  AGREE: Multiple dietary modification options and treatment options were discussed and  Akya agreed to follow the recommendations documented in the above note.  ARRANGE: Abigale was educated on the importance of frequent visits to treat obesity as outlined per CMS and USPSTF guidelines and agreed to schedule her next follow up appointment today.  Cristi LoronI, Joanne Murray, am acting as Energy managertranscriptionist for [employer]el A. Manson PasseyBrown, DO  I have  reviewed the above documentation for accuracy and completeness, and I agree with the above. -Corinna CapraAngel Brown, DO

## 2018-06-01 ENCOUNTER — Encounter (INDEPENDENT_AMBULATORY_CARE_PROVIDER_SITE_OTHER): Payer: Self-pay

## 2018-06-01 ENCOUNTER — Ambulatory Visit (INDEPENDENT_AMBULATORY_CARE_PROVIDER_SITE_OTHER): Payer: PRIVATE HEALTH INSURANCE | Admitting: Bariatrics

## 2018-07-27 ENCOUNTER — Ambulatory Visit: Payer: Self-pay | Admitting: Family Medicine

## 2018-07-28 ENCOUNTER — Encounter: Payer: Self-pay | Admitting: Internal Medicine

## 2018-07-28 ENCOUNTER — Ambulatory Visit: Payer: PRIVATE HEALTH INSURANCE | Admitting: Internal Medicine

## 2018-07-28 VITALS — BP 110/78 | HR 106 | Temp 98.3°F | Wt 256.9 lb

## 2018-07-28 DIAGNOSIS — B370 Candidal stomatitis: Secondary | ICD-10-CM

## 2018-07-28 DIAGNOSIS — E119 Type 2 diabetes mellitus without complications: Secondary | ICD-10-CM

## 2018-07-28 DIAGNOSIS — B373 Candidiasis of vulva and vagina: Secondary | ICD-10-CM | POA: Diagnosis not present

## 2018-07-28 DIAGNOSIS — Z794 Long term (current) use of insulin: Secondary | ICD-10-CM

## 2018-07-28 DIAGNOSIS — B3731 Acute candidiasis of vulva and vagina: Secondary | ICD-10-CM

## 2018-07-28 MED ORDER — NYSTATIN 100000 UNIT/ML MT SUSP: 5.0000 mL | Freq: Four times a day (QID) | OROMUCOSAL | 0 refills | Status: DC

## 2018-07-28 MED ORDER — FLUCONAZOLE 150 MG PO TABS: 150.0000 mg | ORAL_TABLET | Freq: Once | ORAL | 0 refills | Status: AC

## 2018-07-28 NOTE — Progress Notes (Signed)
Acute Office Visit     CC/Reason for Visit: Vaginal itching, "I have diabetic thrush"  HPI: Tammy Delgado is a [age] y.oAnise Salvo. female who is coming in today for an acute visit.  Her past medical history is significant for type 2 diabetes that is not currently well controlled.  She states that over the past week she has noticed a thick white vaginal discharge with vaginal itching, in the past 3 days has noticed white plaques on her tongue and palate.  She is being worked in today as an acute visit for these issues.  She denies fever, dysphagia, abdominal pain.  Past Medical/Surgical History: Past Medical History:  Diagnosis Date  . Anemia   . Diabetes mellitus without complication [facility](HCC)     Past Surgical History:  Procedure Laterality Date  . CESAREAN SECTION  1995 and 2007    Social History:  reports that she has never smoked. She has never used smokeless tobacco. She reports that she does not drink alcohol or use drugs.  Allergies: Allergies  Allergen Reactions  . Chicken Allergy     Family History:  Family History  Problem Relation Age of Onset  . Diabetes Mother   . Hypertension Mother   . Cancer Mother   . Hypertension Maternal Grandmother   . Diabetes Maternal Grandmother   . Hypertension Father   . Cancer Father      Current Outpatient Medications:  .  insulin aspart protamine - aspart (NOVOLOG MIX 70/30 FLEXPEN) (70-30) 100 UNIT/ML FlexPen, INJECT 25 units in the am and 35 units in the pm, Disp: 5 pen, Rfl: 11 .  Insulin Pen Needle (BD PEN NEEDLE NANO U/F) 32G X 4 MM MISC, USE AS DIRECTED, Disp: 100 each, Rfl: 5 .  metFORMIN (GLUCOPHAGE) 1000 MG tablet, Take 1 tablet (1,000 mg total) by mouth 2 (two) times daily with a meal., Disp: 60 tablet, Rfl: 311 .  fluconazole (DIFLUCAN) 150 MG tablet, Take 1 tablet (150 mg total) by mouth once for 1 dose., Disp: 1 tablet, Rfl: 0 .  nystatin (MYCOSTATIN) 100000 UNIT/ML suspension, Take 5 mLs (500,000 Units total) by  mouth 4 (four) times daily., Disp: 60 mL, Rfl: 0  Review of Systems:  Constitutional: Denies fever, chills, diaphoresis, appetite change and fatigue.  HEENT: Denies photophobia, eye pain, redness, hearing loss, ear pain, congestion, sore throat, rhinorrhea, sneezing, mouth sores, trouble swallowing, neck pain, neck stiffness and tinnitus.   Respiratory: Denies SOB, DOE, cough, chest tightness,  and wheezing.   Cardiovascular: Denies chest pain, palpitations and leg swelling.  Gastrointestinal: Denies nausea, vomiting, abdominal pain, diarrhea, constipation, blood in stool and abdominal distention.  Genitourinary: Denies dysuria, urgency, frequency, hematuria, flank pain and difficulty urinating.  Endocrine: Denies: hot or cold intolerance, sweats, changes in hair or nails, polyuria, polydipsia. Musculoskeletal: Denies myalgias, back pain, joint swelling, arthralgias and gait problem.  Skin: Denies pallor, rash and wound.  Neurological: Denies dizziness, seizures, syncope, weakness, light-headedness, numbness and headaches.  Hematological: Denies adenopathy. Easy bruising, personal or family bleeding history  Psychiatric/Behavioral: Denies suicidal ideation, mood changes, confusion, nervousness, sleep disturbance and agitation    Physical Exam: Vitals:   07/28/18 1400  BP: 110/78  Pulse: (!) 106  Temp: 98.3 F (36.8 C)  TempSrc: Oral  SpO2: 97%  Weight: 256 lb 14.4 oz (116.5 kg)    Body mass index is 42.75 kg/m.    Constitutional: NAD, calm, comfortable Eyes: PERRL, lids and conjunctivae normal, wears corrective lenses ENMT: Mucous  membranes are moist.,  Normal dentition, white plaques on her tongue, soft and hard palate.   Respiratory: clear to auscultation bilaterally, no wheezing, no crackles. Normal respiratory effort. No accessory muscle use.  Cardiovascular: Regular rate and rhythm, no murmurs / rubs / gallops. No extremity edema. 2+ pedal pulses. No carotid bruits.    Psychiatric: Normal judgment and insight. Alert and oriented x 3. Normal mood.    Impression and Plan:  Type 2 diabetes mellitus treated with insulin (HCC)  MORBID OBESITY  Vaginal yeast infection - Plan: fluconazole (DIFLUCAN) 150 MG tablet  Thrush - Plan: nystatin (MYCOSTATIN) 100000 UNIT/ML suspension  -Will be prescribed fluconazole 150 mg tablet x1 for her vaginal yeast. -Has also been instructed to swish and swallow nystatin suspension 4 times a day. -Prescriptions have been given to pharmacy. -Advised to schedule follow-up with her PCP if issues are not resolving.   Patient Instructions  -Take fluconazole once and nystatin suspension 5 ml 4 times a day. -Hope you feel better soon!. -Schedule follow-up with PCP if your symptoms do not improve.     Chaya Jan, MD [place] [facility]

## 2018-07-28 NOTE — Patient Instructions (Signed)
-  Take fluconazole once and nystatin suspension 5 ml 4 times a day. -Hope you feel better soon!. -Schedule follow-up with PCP if your symptoms do not improve.

## 2018-08-31 ENCOUNTER — Ambulatory Visit: Payer: Self-pay | Admitting: *Deleted

## 2018-08-31 DIAGNOSIS — B370 Candidal stomatitis: Secondary | ICD-10-CM

## 2018-08-31 MED ORDER — FLUCONAZOLE 150 MG PO TABS: 150.0000 mg | ORAL_TABLET | Freq: Once | ORAL | 5 refills | Status: AC

## 2018-08-31 MED ORDER — NYSTATIN 100000 UNIT/ML MT SUSP: OROMUCOSAL | 1 refills | Status: DC

## 2018-08-31 NOTE — Telephone Encounter (Signed)
Call in Diflucan 150 mg to take as needed, #1 with 5 rf. Also Nystatin oral susp. To swish and spit 5 ml QID as needed, 300 ml with one rf

## 2018-08-31 NOTE — Telephone Encounter (Signed)
Dr. Fry please advise. Thanks  

## 2018-08-31 NOTE — Telephone Encounter (Signed)
Pt states that her vaginal yeast and oral thrush have come back; she states that she felt the yeast coming back on 08/28/18, and orally symptoms on 08/29/18; recommendations made per nurse triage protocol; the pt is agreeable to being seen in office but her preference is that diflucan and mouth[facility] [address] [place], [place]; her contact number is [phone], and a detailed message can be left; she is normally seen by Dr Clent RidgesFry, [facility]; will route to office for final disposition.                Reason for Disposition . MODERATE-SEVERE itching (i.e., interferes with school, work, or sleep)  Answer Assessment - Initial Assessment Questions 1. SYMPTOM: "What's the main symptom you're concerned about?" (e.g., pain, itching, dryness)     White thick discharge 2. LOCATION: "Where is the located?" (e.g., inside/outside, left/right)     Inside 3. ONSET: "When did the start?"     08/28/18 4. PAIN: "Is there any pain?" If so, ask: "How bad is it?" (Scale: 1-10; mild, moderate, severe)     no 5. ITCHING: "Is there any itching?" If so, ask: "How bad is it?" (Scale: 1-10; mild, moderate, severe)     Yes moderate 6. CAUSE: "What do you think is causing the discharge?" "Have you had the same problem before? What happened then?"     Yes seen in office 07/28/18 7. OTHER SYMPTOMS: "Do you have any other symptoms?" (e.g., fever, itching, vaginal bleeding, pain with urination, injury to genital area, vaginal foreign body)     Oral thrush 8. PREGNANCY: "Is there any chance you are pregnant?" "When was your last menstrual period?"     No LMP 08/04/18  Protocols used: [facility]

## 2018-08-31 NOTE — Telephone Encounter (Signed)
Called and spoke with pt and she is aware of rx that have been sent to the pharmacy for her.

## 2018-08-31 NOTE — Addendum Note (Signed)
Addended by: Marcellus ScottADKINS, Peyson Delao L on: 08/31/2018 02:28 PM   Modules accepted: Orders

## 2018-10-13 ENCOUNTER — Other Ambulatory Visit: Payer: Self-pay | Admitting: Family Medicine

## 2018-11-16 ENCOUNTER — Telehealth: Payer: Self-pay | Admitting: Family Medicine

## 2018-11-20 NOTE — Telephone Encounter (Signed)
Pt called she is asking for 90 day supply to be sent to pharmacy.  [facility]  cb is [phone]

## 2018-12-19 ENCOUNTER — Other Ambulatory Visit: Payer: Self-pay | Admitting: Family Medicine

## 2018-12-21 ENCOUNTER — Other Ambulatory Visit: Payer: Self-pay | Admitting: *Deleted

## 2018-12-21 MED ORDER — INSULIN ASPART PROT & ASPART (70-30 MIX) 100 UNIT/ML PEN: PEN_INJECTOR | SUBCUTANEOUS | 0 refills | Status: DC

## 2018-12-21 MED ORDER — INSULIN PEN NEEDLE 32G X 4 MM MISC: 5 refills | Status: DC

## 2018-12-21 MED ORDER — METFORMIN HCL 1000 MG PO TABS: 1000.0000 mg | ORAL_TABLET | Freq: Two times a day (BID) | ORAL | Status: DC

## 2019-01-02 ENCOUNTER — Ambulatory Visit (INDEPENDENT_AMBULATORY_CARE_PROVIDER_SITE_OTHER): Payer: PRIVATE HEALTH INSURANCE | Admitting: Family Medicine

## 2019-01-02 ENCOUNTER — Telehealth: Payer: Self-pay | Admitting: Family Medicine

## 2019-01-02 ENCOUNTER — Other Ambulatory Visit: Payer: Self-pay

## 2019-01-02 ENCOUNTER — Encounter: Payer: Self-pay | Admitting: Family Medicine

## 2019-01-02 DIAGNOSIS — E119 Type 2 diabetes mellitus without complications: Secondary | ICD-10-CM | POA: Diagnosis not present

## 2019-01-02 DIAGNOSIS — Z794 Long term (current) use of insulin: Secondary | ICD-10-CM

## 2019-01-02 DIAGNOSIS — J019 Acute sinusitis, unspecified: Secondary | ICD-10-CM

## 2019-01-02 DIAGNOSIS — S39012A Strain of muscle, fascia and tendon of lower back, initial encounter: Secondary | ICD-10-CM

## 2019-01-02 MED ORDER — OXYCODONE-ACETAMINOPHEN 10-325 MG PO TABS: 1.0000 | ORAL_TABLET | ORAL | 0 refills | Status: AC | PRN

## 2019-01-02 MED ORDER — PHENTERMINE HCL 37.5 MG PO CAPS: 37.5000 mg | ORAL_CAPSULE | ORAL | 1 refills | Status: DC

## 2019-01-02 MED ORDER — AZITHROMYCIN 250 MG PO TABS: ORAL_TABLET | ORAL | 0 refills | Status: DC

## 2019-01-02 MED ORDER — IBUPROFEN 800 MG PO TABS: 800.0000 mg | ORAL_TABLET | Freq: Four times a day (QID) | ORAL | 3 refills | Status: DC | PRN

## 2019-01-02 MED ORDER — METHOCARBAMOL 750 MG PO TABS: 750.0000 mg | ORAL_TABLET | Freq: Four times a day (QID) | ORAL | 3 refills | Status: DC

## 2019-01-02 MED ORDER — INSULIN PEN NEEDLE 32G X 4 MM MISC: 5 refills | Status: DC

## 2019-01-02 MED ORDER — METFORMIN HCL 1000 MG PO TABS: 1000.0000 mg | ORAL_TABLET | Freq: Two times a day (BID) | ORAL | 3 refills | Status: DC

## 2019-01-02 MED ORDER — INSULIN ASPART PROT & ASPART (70-30 MIX) 100 UNIT/ML PEN: PEN_INJECTOR | SUBCUTANEOUS | 3 refills | Status: DC

## 2019-01-02 NOTE — Progress Notes (Signed)
Subjective:    Patient ID: Tammy Delgado, female    DOB: 1970/12/17, 48 y.o.   MRN: [id number]  HPI Virtual Visit via Video Note  I connected with the patient on 01/02/19 at  2:00 PM EDT by a video enabled telemedicine application and verified that I am speaking with the correct person using two identifiers.  Location patient: home Location provider:work or home office Persons participating in the virtual visit: patient, provider  I discussed the limitations of evaluation and management by telemedicine and the availability of in person appointments. The patient expressed understanding and agreed to proceed.   HPI: Here for several issues. First she needs refills on diabetic meds. Her am fasting glucoses have been running from 120-160 lately. Her last A1c in September was 9.6. She has been exercising however and she has tightened up on her diet. Her BP is stable at 120/70. She injured her lower back yesterday morning when getting out of the shower, and now she has pain and spasm in the lower back. Using heat. No radiation to the legs. Also for 10 days she has had sinus congestion, PND, blowing yellow mucus from the nose, and a ST. No fever or cough or SOB.    ROS: See pertinent positives and negatives per HPI.  Past Medical History:  Diagnosis Date  . Anemia   . Diabetes mellitus without complication [facility])     Past Surgical History:  Procedure Laterality Date  . CESAREAN SECTION  1995 and 2007    Family History  Problem Relation Age of Onset  . Diabetes Mother   . Hypertension Mother   . Cancer Mother   . Hypertension Maternal Grandmother   . Diabetes Maternal Grandmother   . Hypertension Father   . Cancer Father      Current Outpatient Medications:  .  insulin aspart protamine - aspart (NOVOLOG MIX 70/30 FLEXPEN) (70-30) 100 UNIT/ML FlexPen, INJECT 25 UNITS SUBCUTANEOUSLY ONCE DAILY IN THE MORNING AND 35 UNITS ONCE IN THE EVENING, Disp: 15 mL, Rfl: 3 .  Insulin Pen  Needle (BD PEN NEEDLE NANO U/F) 32G X 4 MM MISC, USE AS DIRECTED, Disp: 100 each, Rfl: 5 .  metFORMIN (GLUCOPHAGE) 1000 MG tablet, Take 1 tablet (1,000 mg total) by mouth 2 (two) times daily with a meal., Disp: 180 tablet, Rfl: 3 .  nystatin (MYCOSTATIN) 100000 UNIT/ML suspension, To swish and spit 5 ml  Four times daily as needed, Disp: 300 mL, Rfl: 1 .  azithromycin (ZITHROMAX Z-PAK) 250 MG tablet, As directed, Disp: 6 each, Rfl: 0 .  ibuprofen (ADVIL) 800 MG tablet, Take 1 tablet (800 mg total) by mouth every 6 (six) hours as needed for moderate pain., Disp: 120 tablet, Rfl: 3 .  methocarbamol (ROBAXIN-750) 750 MG tablet, Take 1 tablet (750 mg total) by mouth 4 (four) times daily., Disp: 120 tablet, Rfl: 3 .  oxyCODONE-acetaminophen (PERCOCET) 10-325 MG tablet, Take 1 tablet by mouth every 4 (four) hours as needed for up to 5 days for pain., Disp: 30 tablet, Rfl: 0 .  phentermine 37.5 MG capsule, Take 1 capsule (37.5 mg total) by mouth every morning., Disp: 90 capsule, Rfl: 1  EXAM:  VITALS per patient if applicable:  GENERAL: alert, oriented, appears well and in no acute distress  HEENT: atraumatic, conjunttiva clear, no obvious abnormalities on inspection of external nose and ears  NECK: normal movements of the head and neck  LUNGS: on inspection no signs of respiratory distress, breathing rate appears normal,  no obvious gross SOB, gasping or wheezing  CV: no obvious cyanosis  MS: moves all visible extremities without noticeable abnormality  PSYCH/NEURO: pleasant and cooperative, no obvious depression or anxiety, speech and thought processing grossly intact  ASSESSMENT AND PLAN: For the diabetes, we refilled her meds and supplies. We will keep the Novolog at the current dosages, and she will stick with the diet and exercise. Hopefully this summer when the virus pandemic settles down we can bring her in for lab work including an A1c. For the lumbar strain sh can use Robaxin,  Ibuprofen, and Percocet as needed. For the sinusitis sh will try a Zpack.  Gershon CraneStephen Christel Bai, MD  Discussed the following assessment and plan:  No diagnosis found.     I discussed the assessment and treatment plan with the patient. The patient was provided an opportunity to ask questions and all were answered. The patient agreed with the plan and demonstrated an understanding of the instructions.   The patient was advised to call back or seek an in-person evaluation if the symptoms worsen or if the condition fails to improve as anticipated.     Review of Systems     Objective:   Physical Exam        Assessment & Plan:

## 2019-01-02 NOTE — Telephone Encounter (Signed)
Copied from CRM [id number]. Topic: Quick Communication - See Telephone Encounter >> Jan 02, 2019  2:45 PM Terisa Starr wrote: CRM for notification. See Telephone encounter for: 01/02/19.  [facility] has many questions regarding oxyCODONE-acetaminophen (PERCOCET) 10-325 MG tablet before they can fill. Please advise.

## 2019-01-02 NOTE — Telephone Encounter (Signed)
This has been sent in  

## 2019-01-11 ENCOUNTER — Other Ambulatory Visit: Payer: Self-pay | Admitting: Family Medicine

## 2019-01-11 MED ORDER — INSULIN PEN NEEDLE 32G X 4 MM MISC: 5 refills | Status: DC

## 2019-01-18 ENCOUNTER — Other Ambulatory Visit: Payer: Self-pay | Admitting: Family Medicine

## 2019-05-07 ENCOUNTER — Other Ambulatory Visit: Payer: Self-pay | Admitting: Family Medicine

## 2019-08-23 ENCOUNTER — Other Ambulatory Visit: Payer: Self-pay | Admitting: Family Medicine

## 2019-10-11 ENCOUNTER — Other Ambulatory Visit: Payer: Self-pay

## 2019-10-11 DIAGNOSIS — R350 Frequency of micturition: Secondary | ICD-10-CM | POA: Diagnosis not present

## 2019-10-11 DIAGNOSIS — R102 Pelvic and perineal pain: Secondary | ICD-10-CM | POA: Diagnosis not present

## 2019-10-11 DIAGNOSIS — R309 Painful micturition, unspecified: Secondary | ICD-10-CM | POA: Diagnosis not present

## 2019-10-11 DIAGNOSIS — Z6839 Body mass index (BMI) 39.0-39.9, adult: Secondary | ICD-10-CM | POA: Diagnosis not present

## 2019-10-12 ENCOUNTER — Ambulatory Visit: Payer: PRIVATE HEALTH INSURANCE | Admitting: Family Medicine

## 2019-10-31 DIAGNOSIS — Z6839 Body mass index (BMI) 39.0-39.9, adult: Secondary | ICD-10-CM | POA: Diagnosis not present

## 2019-10-31 DIAGNOSIS — Z1231 Encounter for screening mammogram for malignant neoplasm of breast: Secondary | ICD-10-CM | POA: Diagnosis not present

## 2019-10-31 DIAGNOSIS — Z124 Encounter for screening for malignant neoplasm of cervix: Secondary | ICD-10-CM | POA: Diagnosis not present

## 2019-10-31 DIAGNOSIS — Z01419 Encounter for gynecological examination (general) (routine) without abnormal findings: Secondary | ICD-10-CM | POA: Diagnosis not present

## 2019-11-12 ENCOUNTER — Other Ambulatory Visit: Payer: Self-pay | Admitting: Adult Health

## 2020-01-28 DIAGNOSIS — J9809 Other diseases of bronchus, not elsewhere classified: Secondary | ICD-10-CM | POA: Diagnosis not present

## 2020-02-04 ENCOUNTER — Other Ambulatory Visit: Payer: Self-pay | Admitting: Family Medicine

## 2020-02-28 ENCOUNTER — Other Ambulatory Visit: Payer: Self-pay | Admitting: Family Medicine

## 2020-04-23 ENCOUNTER — Other Ambulatory Visit: Payer: Self-pay | Admitting: Family Medicine

## 2020-04-25 ENCOUNTER — Other Ambulatory Visit: Payer: Self-pay

## 2020-04-25 ENCOUNTER — Encounter: Payer: Self-pay | Admitting: Family Medicine

## 2020-04-25 ENCOUNTER — Telehealth: Payer: Self-pay | Admitting: Family Medicine

## 2020-04-25 ENCOUNTER — Ambulatory Visit (INDEPENDENT_AMBULATORY_CARE_PROVIDER_SITE_OTHER): Payer: BC Managed Care – PPO | Admitting: Family Medicine

## 2020-04-25 VITALS — BP 120/70 | HR 82 | Temp 98.7°F | Ht 65.0 in | Wt 255.8 lb

## 2020-04-25 DIAGNOSIS — E119 Type 2 diabetes mellitus without complications: Secondary | ICD-10-CM | POA: Diagnosis not present

## 2020-04-25 DIAGNOSIS — Z794 Long term (current) use of insulin: Secondary | ICD-10-CM | POA: Diagnosis not present

## 2020-04-25 DIAGNOSIS — Z0000 Encounter for general adult medical examination without abnormal findings: Secondary | ICD-10-CM

## 2020-04-25 MED ORDER — NOVOLOG 70/30 FLEXPEN RELION (70-30) 100 UNIT/ML ~~LOC~~ SUPN: PEN_INJECTOR | SUBCUTANEOUS | 3 refills | Status: DC

## 2020-04-25 MED ORDER — METFORMIN HCL 1000 MG PO TABS: 1000.0000 mg | ORAL_TABLET | Freq: Two times a day (BID) | ORAL | 3 refills | Status: DC

## 2020-04-25 MED ORDER — AZITHROMYCIN 250 MG PO TABS: ORAL_TABLET | ORAL | 0 refills | Status: DC

## 2020-04-25 MED ORDER — PHENTERMINE HCL 37.5 MG PO TABS: 37.5000 mg | ORAL_TABLET | Freq: Every day | ORAL | 1 refills | Status: DC

## 2020-04-25 MED ORDER — PHENTERMINE HCL 37.5 MG PO CAPS: 37.5000 mg | ORAL_CAPSULE | ORAL | 1 refills | Status: DC

## 2020-04-25 MED ORDER — FLUCONAZOLE 150 MG PO TABS: 150.0000 mg | ORAL_TABLET | Freq: Once | ORAL | 5 refills | Status: AC

## 2020-04-25 NOTE — Telephone Encounter (Signed)
Spoke with the patient, she is aware that the tablets have been sent in. Nothing further needed.

## 2020-04-25 NOTE — Telephone Encounter (Signed)
I sent in the tabs

## 2020-04-25 NOTE — Telephone Encounter (Signed)
Medication phentermine 37.5 MG capsule  is unavailable in capsules but they have it in tablets. Need a reorder for the tablets  [facility] [address] [place], [place] - [address]  [address], [place] [address]  Phone:  [phone] Fax:  [phone]   Please advise

## 2020-04-25 NOTE — Telephone Encounter (Signed)
Please advise. Ok to change Rx? °

## 2020-04-25 NOTE — Progress Notes (Signed)
   Subjective:    Patient ID: Tammy Delgado, female    DOB: [birth year], 49 y.o.   MRN: [id number]  HPI Here for a well exam. She is doing well. She asks for a rx for a Freestyle continuous glucose monitor.    Review of Systems  Constitutional: Negative.   HENT: Negative.   Eyes: Negative.   Respiratory: Negative.   Cardiovascular: Negative.   Gastrointestinal: Negative.   Genitourinary: Negative for decreased urine volume, difficulty urinating, dyspareunia, dysuria, enuresis, flank pain, frequency, hematuria, pelvic pain and urgency.  Musculoskeletal: Negative.   Skin: Negative.   Neurological: Negative.   Psychiatric/Behavioral: Negative.        Objective:   Physical Exam Constitutional:      General: She is not in acute distress.    Appearance: She is well-developed. She is obese.  HENT:     Head: Normocephalic and atraumatic.     Right Ear: External ear normal.     Left Ear: External ear normal.     Nose: Nose normal.     Mouth/Throat:     Pharynx: No oropharyngeal exudate.  Eyes:     General: No scleral icterus.    Conjunctiva/sclera: Conjunctivae normal.     Pupils: Pupils are equal, round, and reactive to light.  Neck:     Thyroid: No thyromegaly.     Vascular: No JVD.  Cardiovascular:     Rate and Rhythm: Normal rate and regular rhythm.     Heart sounds: Normal heart sounds. No murmur heard.  No friction rub. No gallop.   Pulmonary:     Effort: Pulmonary effort is normal. No respiratory distress.     Breath sounds: Normal breath sounds. No wheezing or rales.  Chest:     Chest wall: No tenderness.  Abdominal:     General: Bowel sounds are normal. There is no distension.     Palpations: Abdomen is soft. There is no mass.     Tenderness: There is no abdominal tenderness. There is no guarding or rebound.  Musculoskeletal:        General: No tenderness. Normal range of motion.     Cervical back: Normal range of motion and neck supple.  Lymphadenopathy:       Cervical: No cervical adenopathy.  Skin:    General: Skin is warm and dry.     Findings: No erythema or rash.  Neurological:     Mental Status: She is alert and oriented to person, place, and time.     Cranial Nerves: No cranial nerve deficit.     Motor: No abnormal muscle tone.     Coordination: Coordination normal.     Deep Tendon Reflexes: Reflexes are normal and symmetric. Reflexes normal.  Psychiatric:        Behavior: Behavior normal.        Thought Content: Thought content normal.        Judgment: Judgment normal.           Assessment & Plan:  Well exam. We discussed diet and exercise. Get fasting labs.  Gershon Crane, MD

## 2020-04-26 LAB — BASIC METABOLIC PANEL
BUN: 8 mg/dL (ref 7–25)
CO2: 27 mmol/L (ref 20–32)
Calcium: 9 mg/dL (ref 8.6–10.2)
Chloride: 101 mmol/L (ref 98–110)
Creat: 0.8 mg/dL (ref 0.50–1.10)
Glucose, Bld: 253 mg/dL — ABNORMAL HIGH (ref 65–99)
Potassium: 4.1 mmol/L (ref 3.5–5.3)
Sodium: 136 mmol/L (ref 135–146)

## 2020-04-26 LAB — CBC WITH DIFFERENTIAL/PLATELET
Absolute Monocytes: 456 cells/uL (ref 200–950)
Basophils Absolute: 61 cells/uL (ref 0–200)
Basophils Relative: 0.8 %
Eosinophils Absolute: 167 cells/uL (ref 15–500)
Eosinophils Relative: 2.2 %
HCT: 38.2 % (ref 35.0–45.0)
Hemoglobin: 12.5 g/dL (ref 11.7–15.5)
Lymphs Abs: 3374 cells/uL (ref 850–3900)
MCH: 29.6 pg (ref 27.0–33.0)
MCHC: 32.7 g/dL (ref 32.0–36.0)
MCV: 90.5 fL (ref 80.0–100.0)
MPV: 11 fL (ref 7.5–12.5)
Monocytes Relative: 6 %
Neutro Abs: 3542 cells/uL (ref 1500–7800)
Neutrophils Relative %: 46.6 %
Platelets: 277 10*3/uL (ref 140–400)
RBC: 4.22 10*6/uL (ref 3.80–5.10)
RDW: 13.4 % (ref 11.0–15.0)
Total Lymphocyte: 44.4 %
WBC: 7.6 10*3/uL (ref 3.8–10.8)

## 2020-04-26 LAB — LIPID PANEL
Cholesterol: 178 mg/dL (ref <200)
HDL: 44 mg/dL — ABNORMAL LOW (ref >=50)
LDL Cholesterol (Calc): 119 mg/dL (calc) — ABNORMAL HIGH
Non-HDL Cholesterol (Calc): 134 mg/dL (calc) — ABNORMAL HIGH (ref <130)
Total CHOL/HDL Ratio: 4 (calc) (ref <5.0)
Triglycerides: 62 mg/dL (ref <150)

## 2020-04-26 LAB — HEPATIC FUNCTION PANEL
AG Ratio: 1.3 (calc) (ref 1.0–2.5)
ALT: 13 U/L (ref 6–29)
AST: 14 U/L (ref 10–35)
Albumin: 4 g/dL (ref 3.6–5.1)
Alkaline phosphatase (APISO): 98 U/L (ref 31–125)
Bilirubin, Direct: 0.2 mg/dL (ref 0.0–0.2)
Globulin: 3.2 g/dL (calc) (ref 1.9–3.7)
Indirect Bilirubin: 0.5 mg/dL (calc) (ref 0.2–1.2)
Total Bilirubin: 0.7 mg/dL (ref 0.2–1.2)
Total Protein: 7.2 g/dL (ref 6.1–8.1)

## 2020-04-26 LAB — HEMOGLOBIN A1C
Hgb A1c MFr Bld: 10.4 % of total Hgb — ABNORMAL HIGH (ref <5.7)
Mean Plasma Glucose: 252 (calc)
eAG (mmol/L): 13.9 (calc)

## 2020-04-26 LAB — TSH: TSH: 2.38 mIU/L

## 2020-06-18 ENCOUNTER — Other Ambulatory Visit: Payer: Self-pay | Admitting: Family Medicine

## 2020-06-23 ENCOUNTER — Telehealth: Payer: Self-pay

## 2020-06-23 NOTE — Telephone Encounter (Signed)
Pharmacy called in needing to know if the patient was needing a Freestyle cont machine. States patient gave them a paper script and the pharmacy is a little confused on the orders. Also wants to know if its a Freestyle 1 or 2     Please call and advise

## 2020-06-23 NOTE — Telephone Encounter (Signed)
[facility] called and spoke to [place], [facility] who says the patient brought in a paper prescription for a Freestyle machine. She says they will need an order for a Freestyle Limbre Sensor 1 or 2 with refills. She says most patients prefer #1, so they can download on their cell phone. Advised this will be taken care of once provider approves.

## 2020-06-25 MED ORDER — FREESTYLE LIBRE 14 DAY SENSOR MISC: 1.0000 | Freq: Two times a day (BID) | 2 refills | Status: DC

## 2020-06-25 NOTE — Telephone Encounter (Signed)
Make it a type 1 with supplies

## 2020-06-25 NOTE — Telephone Encounter (Signed)
[facility] called and spoke to [place], [facility] and advised a prescription will be sent per Dr. Clent Ridges orders.

## 2020-07-09 ENCOUNTER — Other Ambulatory Visit: Payer: Self-pay | Admitting: Family Medicine

## 2020-07-09 NOTE — Telephone Encounter (Signed)
Last OV 04-25-20 Last refill 04-25-20  Please advise

## 2020-07-10 ENCOUNTER — Other Ambulatory Visit: Payer: Self-pay | Admitting: *Deleted

## 2020-07-10 MED ORDER — FREESTYLE LIBRE 14 DAY READER DEVI: 1.0000 | Freq: Once | 0 refills | Status: AC

## 2020-08-29 ENCOUNTER — Other Ambulatory Visit: Payer: Self-pay | Admitting: Family Medicine

## 2020-11-27 ENCOUNTER — Ambulatory Visit: Payer: BC Managed Care – PPO | Admitting: Family Medicine

## 2020-11-27 ENCOUNTER — Encounter: Payer: Self-pay | Admitting: Family Medicine

## 2020-11-27 ENCOUNTER — Ambulatory Visit (INDEPENDENT_AMBULATORY_CARE_PROVIDER_SITE_OTHER): Payer: BC Managed Care – PPO

## 2020-11-27 ENCOUNTER — Other Ambulatory Visit: Payer: Self-pay

## 2020-11-27 VITALS — BP 110/82 | HR 78 | Temp 98.3°F | Wt 250.0 lb

## 2020-11-27 DIAGNOSIS — R0602 Shortness of breath: Secondary | ICD-10-CM | POA: Diagnosis not present

## 2020-11-27 DIAGNOSIS — B370 Candidal stomatitis: Secondary | ICD-10-CM

## 2020-11-27 DIAGNOSIS — Z794 Long term (current) use of insulin: Secondary | ICD-10-CM | POA: Diagnosis not present

## 2020-11-27 DIAGNOSIS — E119 Type 2 diabetes mellitus without complications: Secondary | ICD-10-CM | POA: Diagnosis not present

## 2020-11-27 LAB — POCT GLYCOSYLATED HEMOGLOBIN (HGB A1C): Hemoglobin A1C: 9.4 % — AB (ref 4.0–5.6)

## 2020-11-27 MED ORDER — NYSTATIN 100000 UNIT/ML MT SUSP: OROMUCOSAL | 1 refills | Status: DC

## 2020-11-27 MED ORDER — PHENTERMINE HCL 37.5 MG PO TABS
37.5000 mg | ORAL_TABLET | Freq: Every day | ORAL | 1 refills | Status: DC
Start: 2020-11-27 — End: 2021-02-03

## 2020-11-27 MED ORDER — BD PEN NEEDLE NANO 2ND GEN 32G X 4 MM MISC: 3 refills | Status: DC

## 2020-11-27 MED ORDER — FLUCONAZOLE 150 MG PO TABS: 150.0000 mg | ORAL_TABLET | Freq: Once | ORAL | 11 refills | Status: AC

## 2020-11-27 MED ORDER — NOVOLOG 70/30 FLEXPEN RELION (70-30) 100 UNIT/ML ~~LOC~~ SUPN: PEN_INJECTOR | SUBCUTANEOUS | 3 refills | Status: DC

## 2020-11-27 NOTE — Progress Notes (Signed)
   Subjective:    Patient ID: Tammy Delgado, female    DOB: 10-17-1970, 50 y.o.   MRN: [id number]  HPI Here to follow up on diabetes and for other issues. Her glucoses have been in the high 200s and low 300s at home. She has increased  her dose of Novolog 70/30 on her own to 40 units in the AM and 45 in the PM. She admits to not watching her diet the way she should, but she says she has been focused on taking care of her father (who has stage 4 cancer). She thinks she has thrush again because her tongue has been sore and it looks white. Lastly she asks for a CXR. She had a Covid-19 infection in January, and she feels like she has residual issues from this, including mild SOB from time to time. She no longer has any cough or chest discomfort. Her A1c today is 9.4.    Review of Systems  Constitutional: Positive for fatigue.  HENT: Positive for mouth sores.   Respiratory: Positive for shortness of breath. Negative for cough, chest tightness and wheezing.   Cardiovascular: Negative.   Neurological: Negative.        Objective:   Physical Exam Constitutional:      Appearance: She is obese. She is not ill-appearing.  HENT:     Mouth/Throat:     Comments: The tongue has white exudate on it  Cardiovascular:     Rate and Rhythm: Normal rate and regular rhythm.     Pulses: Normal pulses.     Heart sounds: Normal heart sounds.  Pulmonary:     Effort: Pulmonary effort is normal.     Breath sounds: Normal breath sounds.  Neurological:     General: No focal deficit present.     Mental Status: She is alert and oriented to person, place, and time.           Assessment & Plan:  Her diabetes is not well controlled, so we will refer her to Endocrinology to evaluate further. Refer to the Weight Management clinic for obesity. Treat the thrush with Diflucan and Nystatin suspsension. Get a CXR for the SOB.  Gershon Crane, MD

## 2020-11-29 MED ORDER — AZITHROMYCIN 250 MG PO TABS: ORAL_TABLET | ORAL | 0 refills | Status: DC

## 2020-11-29 MED ORDER — HYDROCODONE-HOMATROPINE 5-1.5 MG/5ML PO SYRP: 5.0000 mL | ORAL_SOLUTION | Freq: Four times a day (QID) | ORAL | 0 refills | Status: DC | PRN

## 2020-11-29 NOTE — Addendum Note (Signed)
Addended by: Gershon Crane A on: 11/29/2020 11:48 AM   Modules accepted: Orders

## 2020-11-29 NOTE — Progress Notes (Signed)
Pt notified through MyChart.

## 2020-12-03 ENCOUNTER — Other Ambulatory Visit: Payer: Self-pay

## 2020-12-03 ENCOUNTER — Other Ambulatory Visit (INDEPENDENT_AMBULATORY_CARE_PROVIDER_SITE_OTHER): Payer: BC Managed Care – PPO

## 2020-12-03 DIAGNOSIS — E119 Type 2 diabetes mellitus without complications: Secondary | ICD-10-CM | POA: Diagnosis not present

## 2020-12-03 DIAGNOSIS — Z794 Long term (current) use of insulin: Secondary | ICD-10-CM | POA: Diagnosis not present

## 2020-12-03 LAB — BASIC METABOLIC PANEL
BUN: 8 mg/dL (ref 6–23)
CO2: 29 mEq/L (ref 19–32)
Calcium: 9.4 mg/dL (ref 8.4–10.5)
Chloride: 101 mEq/L (ref 96–112)
Creatinine, Ser: 0.79 mg/dL (ref 0.40–1.20)
GFR: 87.89 mL/min (ref >60.00)
Glucose, Bld: 247 mg/dL — ABNORMAL HIGH (ref 70–99)
Potassium: 3.9 mEq/L (ref 3.5–5.1)
Sodium: 137 mEq/L (ref 135–145)

## 2020-12-03 LAB — LIPID PANEL
Cholesterol: 180 mg/dL (ref 0–200)
HDL: 47.1 mg/dL (ref >39.00)
LDL Cholesterol: 119 mg/dL — ABNORMAL HIGH (ref 0–99)
NonHDL: 133.13
Total CHOL/HDL Ratio: 4
Triglycerides: 72 mg/dL (ref 0.0–149.0)
VLDL: 14.4 mg/dL (ref 0.0–40.0)

## 2020-12-03 LAB — CBC WITH DIFFERENTIAL/PLATELET
Basophils Absolute: 0.1 10*3/uL (ref 0.0–0.1)
Basophils Relative: 1.1 % (ref 0.0–3.0)
Eosinophils Absolute: 0.2 10*3/uL (ref 0.0–0.7)
Eosinophils Relative: 2.5 % (ref 0.0–5.0)
HCT: 37.9 % (ref 36.0–46.0)
Hemoglobin: 12.9 g/dL (ref 12.0–15.0)
Lymphocytes Relative: 41 % (ref 12.0–46.0)
Lymphs Abs: 2.8 10*3/uL (ref 0.7–4.0)
MCHC: 34.2 g/dL (ref 30.0–36.0)
MCV: 89.3 fl (ref 78.0–100.0)
Monocytes Absolute: 0.5 10*3/uL (ref 0.1–1.0)
Monocytes Relative: 7.1 % (ref 3.0–12.0)
Neutro Abs: 3.3 10*3/uL (ref 1.4–7.7)
Neutrophils Relative %: 48.3 % (ref 43.0–77.0)
Platelets: 278 10*3/uL (ref 150.0–400.0)
RBC: 4.24 Mil/uL (ref 3.87–5.11)
RDW: 14.2 % (ref 11.5–15.5)
WBC: 6.9 10*3/uL (ref 4.0–10.5)

## 2020-12-03 LAB — HEPATIC FUNCTION PANEL
ALT: 15 U/L (ref 0–35)
AST: 15 U/L (ref 0–37)
Albumin: 4.1 g/dL (ref 3.5–5.2)
Alkaline Phosphatase: 94 U/L (ref 39–117)
Bilirubin, Direct: 0.2 mg/dL (ref 0.0–0.3)
Total Bilirubin: 1 mg/dL (ref 0.2–1.2)
Total Protein: 7.3 g/dL (ref 6.0–8.3)

## 2020-12-03 LAB — HEMOGLOBIN A1C: Hgb A1c MFr Bld: 10 % — ABNORMAL HIGH (ref 4.6–6.5)

## 2020-12-03 LAB — TSH: TSH: 2.41 u[IU]/mL (ref 0.35–4.50)

## 2020-12-08 ENCOUNTER — Telehealth: Payer: Self-pay | Admitting: Family Medicine

## 2020-12-08 NOTE — Telephone Encounter (Signed)
Patient is requesting a call from Dr. Claris Che nurse. She states she has finished all of her medication but she still has a cough.  Please advise.

## 2020-12-08 NOTE — Telephone Encounter (Signed)
Attempted to contact pt not able to leave a message. Pt voicemail was full. Sent a message on MyChart advising pt to call the office back regarding message sent on 12/08/2020

## 2020-12-11 ENCOUNTER — Other Ambulatory Visit: Payer: Self-pay

## 2020-12-11 MED ORDER — DOXYCYCLINE HYCLATE 100 MG PO TABS: 100.0000 mg | ORAL_TABLET | Freq: Two times a day (BID) | ORAL | 0 refills | Status: DC

## 2020-12-11 NOTE — Telephone Encounter (Signed)
Patient is returning the call, please advise. CB is [phone]

## 2020-12-11 NOTE — Telephone Encounter (Signed)
Call in Doxycycline 100 mg BID for 10 days  

## 2020-12-11 NOTE — Telephone Encounter (Signed)
Spoke with the pt and she complains of a recurrent non-productive cough since the last visit worsening since last night.  States she finished the Z-pak 4 days ago,  she tried Nyquil and Theraflu with no relief and denies fever or shortness of breath.  O2 reading currently 94% at home.  Message sent to PCP.

## 2020-12-11 NOTE — Telephone Encounter (Signed)
See prior note sent to PCP.

## 2020-12-19 ENCOUNTER — Encounter: Payer: Self-pay | Admitting: Family Medicine

## 2020-12-23 MED ORDER — HYDROCODONE-HOMATROPINE 5-1.5 MG/5ML PO SYRP: 5.0000 mL | ORAL_SOLUTION | Freq: Four times a day (QID) | ORAL | 0 refills | Status: DC | PRN

## 2020-12-23 NOTE — Telephone Encounter (Signed)
I sent in for another bottle of cough syrup

## 2020-12-30 ENCOUNTER — Telehealth: Payer: Self-pay | Admitting: Family Medicine

## 2020-12-30 MED ORDER — HYDROCODONE-HOMATROPINE 5-1.5 MG/5ML PO SYRP: 5.0000 mL | ORAL_SOLUTION | Freq: Four times a day (QID) | ORAL | 0 refills | Status: DC | PRN

## 2020-12-30 NOTE — Telephone Encounter (Signed)
Done

## 2020-12-30 NOTE — Telephone Encounter (Signed)
[facility] had a glitch in their system and was wanting to know if the Rx can be resent for HYDROcodone-homatropine (HYCODAN) 5-1.5 MG/5ML syrup  The patient is waiting in the office

## 2020-12-30 NOTE — Addendum Note (Signed)
Addended by: Gershon Crane A on: 12/30/2020 05:05 PM   Modules accepted: Orders

## 2020-12-31 MED ORDER — HYDROCODONE-HOMATROPINE 5-1.5 MG/5ML PO SYRP: 5.0000 mL | ORAL_SOLUTION | ORAL | 0 refills | Status: DC | PRN

## 2020-12-31 NOTE — Telephone Encounter (Signed)
Huye the pharmacist is calling and stated that medication is out of stock and if a rx can be sent for Hydromet instead, please advise. CB is [phone]

## 2020-12-31 NOTE — Telephone Encounter (Signed)
Done

## 2020-12-31 NOTE — Addendum Note (Signed)
Addended by: Gershon Crane A on: 12/31/2020 04:53 PM   Modules accepted: Orders

## 2021-01-01 NOTE — Telephone Encounter (Signed)
The prescription I sent in is already generic. I never prescribe name brand cough syrup. Tell them to fill this please

## 2021-01-01 NOTE — Telephone Encounter (Signed)
Spoke with pt pharmacy state that they don't have the brand name for Hydrocodone syrup but they have the Generic Hydromet. Requests for a new prescription for this

## 2021-01-02 MED ORDER — HYDROCODONE BIT-HOMATROP MBR 5-1.5 MG/5ML PO SOLN: 5.0000 mL | ORAL | 0 refills | Status: DC | PRN

## 2021-01-02 MED ORDER — HYDROCODONE BIT-HOMATROP MBR 5-1.5 MG/5ML PO SOLN: 5.0000 mL | Freq: Four times a day (QID) | ORAL | 0 refills | Status: DC | PRN

## 2021-01-02 NOTE — Addendum Note (Signed)
Addended by: Gershon Crane A on: 01/02/2021 05:22 PM   Modules accepted: Orders

## 2021-01-02 NOTE — Addendum Note (Signed)
Addended by: Eustaquio Boyden on: 01/02/2021 06:26 PM   Modules accepted: Orders

## 2021-01-02 NOTE — Telephone Encounter (Signed)
This was sent in  

## 2021-01-02 NOTE — Telephone Encounter (Signed)
Pharmacy request for a new Rx for generic Hydromet same strength

## 2021-01-02 NOTE — Telephone Encounter (Addendum)
Received call from pharmacy requesting Rx brand hydromet as they don't have generic hycodan in stock. Looks like PCP had been trying to send in as well.  I've sent hydromet in. Cc pcp as fyi

## 2021-01-05 NOTE — Telephone Encounter (Signed)
Noted, pt notified.  

## 2021-01-15 ENCOUNTER — Encounter: Payer: Self-pay | Admitting: Family Medicine

## 2021-01-16 ENCOUNTER — Other Ambulatory Visit: Payer: Self-pay

## 2021-01-16 ENCOUNTER — Ambulatory Visit: Payer: BC Managed Care – PPO | Admitting: Endocrinology

## 2021-01-16 ENCOUNTER — Other Ambulatory Visit: Payer: Self-pay | Admitting: Family Medicine

## 2021-01-16 VITALS — BP 136/80 | HR 83 | Ht 65.0 in | Wt 251.2 lb

## 2021-01-16 DIAGNOSIS — E119 Type 2 diabetes mellitus without complications: Secondary | ICD-10-CM

## 2021-01-16 DIAGNOSIS — Z794 Long term (current) use of insulin: Secondary | ICD-10-CM | POA: Diagnosis not present

## 2021-01-16 MED ORDER — OZEMPIC (0.25 OR 0.5 MG/DOSE) 2 MG/1.5ML ~~LOC~~ SOPN: 0.5000 mg | PEN_INJECTOR | SUBCUTANEOUS | 3 refills | Status: DC

## 2021-01-16 MED ORDER — AZITHROMYCIN 250 MG PO TABS: ORAL_TABLET | ORAL | 0 refills | Status: DC

## 2021-01-16 MED ORDER — FREESTYLE LIBRE 14 DAY SENSOR MISC: 1.0000 | 3 refills | Status: DC

## 2021-01-16 MED ORDER — ALBUTEROL SULFATE HFA 108 (90 BASE) MCG/ACT IN AERS: 2.0000 | INHALATION_SPRAY | RESPIRATORY_TRACT | 2 refills | Status: DC | PRN

## 2021-01-16 MED ORDER — NOVOLOG 70/30 FLEXPEN RELION (70-30) 100 UNIT/ML ~~LOC~~ SUPN: PEN_INJECTOR | SUBCUTANEOUS | 3 refills | Status: DC

## 2021-01-16 NOTE — Patient Instructions (Addendum)
good diet and exercise significantly improve the control of your diabetes.  please let me know if you wish to be referred to a dietician.  high blood sugar is very risky to your health.  you should see an eye doctor and dentist every year.  It is very important to get all recommended vaccinations.  Controlling your blood pressure and cholesterol drastically reduces the damage diabetes does to your body.  Those who smoke should quit.  Please discuss these with your doctor.  check your blood sugar twice a day.  vary the time of day when you check, between before the 3 meals, and at bedtime.  also check if you have symptoms of your blood sugar being too high or too low.  please keep a record of the readings and bring it to your next appointment here (or you can bring the meter itself).  You can write it on any piece of paper.  please call us sooner if your blood sugar goes below 70, or if most of your readings are over 200. Please reduce the insulin to 45 units with breakfast, and 20 units with the evening meal. I have sent a prescription to your pharmacy, to add Ozempic.  Please take just 0.25 mg the first 1 or 2 shots. Please continue the same metformin.   Please come back for a follow-up appointment in 2 months.

## 2021-01-16 NOTE — Telephone Encounter (Signed)
I sent in an inhaler for her to use. I would need to see her OV before we can prescribe any more antibiotics though

## 2021-01-16 NOTE — Telephone Encounter (Signed)
See previous My Chart message from 01/15/2021

## 2021-01-16 NOTE — Progress Notes (Signed)
Subjective:    Patient ID: Tammy Delgado, female    DOB: [birth year], 50 y.o.   MRN: [id number]  HPI pt is referred by Dr Clent Ridges, for diabetes.  Pt states DM was dx'ed in 2014; she is unaware of any chronic complications; she has been on insulin since dx; pt says her diet and exercise are fair; she has never had GDM, pancreatitis, pancreatic surgery, severe hypoglycemia or DKA.  She works as a Software engineer.  She eats 1-2 meals per day.  She takes 70/30 insulin, 45 units BID, and metformin.  She says cbg varies from 68-495.  It is in general higher as the day goes on.   Past Medical History:  Diagnosis Date  . Anemia   . Diabetes mellitus without complication [facility])     Past Surgical History:  Procedure Laterality Date  . CESAREAN SECTION  1995 and 2007    Social History   Socioeconomic History  . Marital status: Married    Spouse name: Bettina Gavia  . Number of children: 2  . Years of education: Not on file  . Highest education level: Not on file  Occupational History  . Occupation: Science writer  Tobacco Use  . Smoking status: Never Smoker  . Smokeless tobacco: Never Used  Vaping Use  . Vaping Use: Never used  Substance and Sexual Activity  . Alcohol use: No  . Drug use: No  . Sexual activity: Not on file  Other Topics Concern  . Not on file  Social History Narrative  . Not on file   Social Determinants of Health   Financial Resource Strain: Not on file  Food Insecurity: Not on file  Transportation Needs: Not on file  Physical Activity: Not on file  Stress: Not on file  Social Connections: Not on file  Intimate Partner Violence: Not on file    Current Outpatient Medications on File Prior to Visit  Medication Sig Dispense Refill  . albuterol (PROAIR HFA) 108 (90 Base) MCG/ACT inhaler Inhale 2 puffs into the lungs every 4 (four) hours as needed for wheezing or shortness of breath. 18 g 2  . HYDROcodone bit-homatropine (HYDROMET) 5-1.5 MG/5ML syrup Take 5 mLs by  mouth every 6 (six) hours as needed for cough. 120 mL 0  . ibuprofen (ADVIL) 800 MG tablet Take 1 tablet (800 mg total) by mouth every 6 (six) hours as needed for moderate pain. 120 tablet 3  . Insulin Pen Needle (BD PEN NEEDLE NANO 2ND GEN) 32G X 4 MM MISC USE AS DIRECTED TWICE DAILY 100 each 3  . metFORMIN (GLUCOPHAGE) 1000 MG tablet Take 1 tablet (1,000 mg total) by mouth 2 (two) times daily with a meal. 180 tablet 3  . methocarbamol (ROBAXIN-750) 750 MG tablet Take 1 tablet (750 mg total) by mouth 4 (four) times daily. 120 tablet 3  . nystatin (MYCOSTATIN) 100000 UNIT/ML suspension To swish and spit 5 ml  Four times daily as needed 300 mL 1  . phentermine (ADIPEX-P) 37.5 MG tablet Take 1 tablet (37.5 mg total) by mouth daily before breakfast. 90 tablet 1   No current facility-administered medications on file prior to visit.    Allergies  Allergen Reactions  . Chicken Allergy     Family History  Problem Relation Age of Onset  . Diabetes Mother   . Hypertension Mother   . Cancer Mother   . Hypertension Maternal Grandmother   . Diabetes Maternal Grandmother   . Hypertension Father   . Cancer  Father     BP 136/80 (BP Location: Right Arm, Patient Position: Sitting, Cuff Size: Large)   Pulse 83   Ht 5\' 5"  (1.651 m)   Wt 251 lb 3.2 oz (113.9 kg)   SpO2 92%   BMI 41.80 kg/m    Review of Systems denies weight loss, n/v, and depression.      Objective:   Physical Exam VITAL SIGNS:  See vs page GENERAL: no distress Pulses: dorsalis pedis intact bilat.   MSK: no deformity of the feet CV: no leg edema Skin:  no ulcer on the feet.  normal color and temp on the feet.   Neuro: sensation is intact to touch on the feet.    Lab Results  Component Value Date   HGBA1C 10.0 (H) 12/03/2020   Lab Results  Component Value Date   CREATININE 0.79 12/03/2020   BUN 8 12/03/2020   NA 137 12/03/2020   K 3.9 12/03/2020   CL 101 12/03/2020   CO2 29 12/03/2020   A1c=9.1%  I have  reviewed outside records, and summarized: Pt was noted to have elevated A1c, and referred here.  Dyspnea, obesity, and thrush were also addressed      Assessment & Plan:  Insulin-requiring type 2 DM: uncontrolled.   Patient Instructions  good diet and exercise significantly improve the control of your diabetes.  please let me know if you wish to be referred to a dietician.  high blood sugar is very risky to your health.  you should see an eye doctor and dentist every year.  It is very important to get all recommended vaccinations.  Controlling your blood pressure and cholesterol drastically reduces the damage diabetes does to your body.  Those who smoke should quit.  Please discuss these with your doctor.  check your blood sugar twice a day.  vary the time of day when you check, between before the 3 meals, and at bedtime.  also check if you have symptoms of your blood sugar being too high or too low.  please keep a record of the readings and bring it to your next appointment here (or you can bring the meter itself).  You can write it on any piece of paper.  please call 12/05/2020 sooner if your blood sugar goes below 70, or if most of your readings are over 200. Please reduce the insulin to 45 units with breakfast, and 20 units with the evening meal. I have sent a prescription to your pharmacy, to add Ozempic.  Please take just 0.25 mg the first 1 or 2 shots. Please continue the same metformin.   Please come back for a follow-up appointment in 2 months.

## 2021-01-19 ENCOUNTER — Ambulatory Visit: Payer: BC Managed Care – PPO | Admitting: Family Medicine

## 2021-01-19 ENCOUNTER — Other Ambulatory Visit: Payer: Self-pay

## 2021-01-19 ENCOUNTER — Encounter: Payer: Self-pay | Admitting: Family Medicine

## 2021-01-19 VITALS — BP 128/82 | HR 87 | Temp 98.7°F | Wt 248.0 lb

## 2021-01-19 DIAGNOSIS — R059 Cough, unspecified: Secondary | ICD-10-CM | POA: Diagnosis not present

## 2021-01-19 MED ORDER — HYDROCODONE BIT-HOMATROP MBR 5-1.5 MG/5ML PO SOLN: 5.0000 mL | ORAL | 0 refills | Status: DC | PRN

## 2021-01-19 MED ORDER — BENZONATATE 200 MG PO CAPS
200.0000 mg | ORAL_CAPSULE | Freq: Four times a day (QID) | ORAL | 0 refills | Status: DC | PRN
Start: 2021-01-19 — End: 2021-04-06

## 2021-01-19 MED ORDER — LEVOFLOXACIN 500 MG PO TABS: 500.0000 mg | ORAL_TABLET | Freq: Every day | ORAL | 0 refills | Status: AC

## 2021-01-19 NOTE — Progress Notes (Signed)
   Subjective:    Patient ID: Tammy Delgado, female    DOB: [birth year], 50 y.o.   MRN: [id number]  HPI Here for a continuing dry cough and occasional SOB. This has been going on since January when she had a Covid-19 infection. In March we got a CXR which showed bilateral infiltrates consistent with pneumonia. She has never had a fever. We gave her a Zpack, and she seemed to feel better for a week or so. Then the symptoms returned. In early May we treated her with 10 days of Doxycycline, and this did not help her at all. The inhaler does help with SOB somewhat.    Review of Systems  Constitutional: Negative.   HENT: Negative.   Eyes: Negative.   Respiratory: Positive for cough and shortness of breath. Negative for wheezing.   Cardiovascular: Negative.        Objective:   Physical Exam Constitutional:      Appearance: Normal appearance. She is not ill-appearing.  Cardiovascular:     Rate and Rhythm: Normal rate and regular rhythm.     Pulses: Normal pulses.     Heart sounds: Normal heart sounds.  Pulmonary:     Effort: Pulmonary effort is normal. No respiratory distress.     Breath sounds: Normal breath sounds. No stridor. No wheezing, rhonchi or rales.  Musculoskeletal:     Right lower leg: No edema.     Left lower leg: No edema.  Neurological:     Mental Status: She is alert.           Assessment & Plan:  Persistent cough, this may be a partially treated pneumonia I suppose. We will give her 10 days of Levaquin. Use Hycodan and Benzonatate as needed for cough relief. She will follow up as needed.  Gershon Crane, MD

## 2021-01-21 DIAGNOSIS — Z124 Encounter for screening for malignant neoplasm of cervix: Secondary | ICD-10-CM | POA: Diagnosis not present

## 2021-01-21 DIAGNOSIS — Z01419 Encounter for gynecological examination (general) (routine) without abnormal findings: Secondary | ICD-10-CM | POA: Diagnosis not present

## 2021-01-21 DIAGNOSIS — Z1231 Encounter for screening mammogram for malignant neoplasm of breast: Secondary | ICD-10-CM | POA: Diagnosis not present

## 2021-01-29 ENCOUNTER — Telehealth: Payer: Self-pay | Admitting: Family Medicine

## 2021-01-29 NOTE — Telephone Encounter (Signed)
Last office visit/last refill- 01/19/2021

## 2021-01-29 NOTE — Telephone Encounter (Signed)
Patient called wanting to know if the orders have been placed for another X-Ray  Please advise

## 2021-01-29 NOTE — Telephone Encounter (Signed)
Pt states that she is not feeling better and requests foe another chest x-ray, ok to place order

## 2021-01-29 NOTE — Telephone Encounter (Signed)
HYDROcodone bit-homatropine (HYCODAN) 5-1.5 MG/5ML syrup  [facility] [address] [place], [place] - [address] Phone:  [phone]  Fax:  [phone]

## 2021-01-30 ENCOUNTER — Other Ambulatory Visit: Payer: Self-pay | Admitting: Family Medicine

## 2021-01-30 NOTE — Telephone Encounter (Signed)
Please advise 

## 2021-01-30 NOTE — Telephone Encounter (Signed)
Pt is calling to see if she can get a refill on Rx Hydrocodone cough syrup b/c she is still coughing and coughing so much that she is urinating on herself.  Pt would like to have a call back to let her know if it will be called in today or if Dr. Clent Ridges will give her something else is not that medication for the cough.  Pharm:  [facility].

## 2021-02-03 ENCOUNTER — Encounter (INDEPENDENT_AMBULATORY_CARE_PROVIDER_SITE_OTHER): Payer: Self-pay | Admitting: Family Medicine

## 2021-02-03 ENCOUNTER — Ambulatory Visit (INDEPENDENT_AMBULATORY_CARE_PROVIDER_SITE_OTHER): Payer: BC Managed Care – PPO | Admitting: Family Medicine

## 2021-02-03 ENCOUNTER — Other Ambulatory Visit: Payer: Self-pay

## 2021-02-03 VITALS — BP 106/71 | HR 80 | Temp 97.8°F | Ht 65.0 in | Wt 243.0 lb

## 2021-02-03 DIAGNOSIS — Z794 Long term (current) use of insulin: Secondary | ICD-10-CM | POA: Diagnosis not present

## 2021-02-03 DIAGNOSIS — R5383 Other fatigue: Secondary | ICD-10-CM | POA: Insufficient documentation

## 2021-02-03 DIAGNOSIS — R0683 Snoring: Secondary | ICD-10-CM | POA: Insufficient documentation

## 2021-02-03 DIAGNOSIS — F509 Eating disorder, unspecified: Secondary | ICD-10-CM | POA: Insufficient documentation

## 2021-02-03 DIAGNOSIS — Z9189 Other specified personal risk factors, not elsewhere classified: Secondary | ICD-10-CM

## 2021-02-03 DIAGNOSIS — Z6841 Body Mass Index (BMI) 40.0 and over, adult: Secondary | ICD-10-CM

## 2021-02-03 DIAGNOSIS — E119 Type 2 diabetes mellitus without complications: Secondary | ICD-10-CM | POA: Insufficient documentation

## 2021-02-03 DIAGNOSIS — R0602 Shortness of breath: Secondary | ICD-10-CM

## 2021-02-03 DIAGNOSIS — E1169 Type 2 diabetes mellitus with other specified complication: Secondary | ICD-10-CM

## 2021-02-03 DIAGNOSIS — Z0289 Encounter for other administrative examinations: Secondary | ICD-10-CM

## 2021-02-03 DIAGNOSIS — F5089 Other specified eating disorder: Secondary | ICD-10-CM

## 2021-02-03 DIAGNOSIS — Z1331 Encounter for screening for depression: Secondary | ICD-10-CM

## 2021-02-03 MED ORDER — HYDROCODONE BIT-HOMATROP MBR 5-1.5 MG/5ML PO SOLN: 5.0000 mL | ORAL | 0 refills | Status: DC | PRN

## 2021-02-03 NOTE — Telephone Encounter (Signed)
Pt was notified through MyChart portal 

## 2021-02-03 NOTE — Telephone Encounter (Signed)
She will need another in person OV before we can get an Xray

## 2021-02-03 NOTE — Addendum Note (Signed)
Addended by: Gershon Crane A on: 02/03/2021 07:53 AM   Modules accepted: Orders

## 2021-02-03 NOTE — Telephone Encounter (Signed)
Patient notified to make OV before any orders for x-ray through MyChart

## 2021-02-03 NOTE — Telephone Encounter (Signed)
Done

## 2021-02-04 ENCOUNTER — Ambulatory Visit: Payer: BC Managed Care – PPO | Admitting: Family Medicine

## 2021-02-04 ENCOUNTER — Ambulatory Visit (INDEPENDENT_AMBULATORY_CARE_PROVIDER_SITE_OTHER)
Admission: RE | Admit: 2021-02-04 | Discharge: 2021-02-04 | Disposition: A | Discharge status: Home / Self Care | Payer: BC Managed Care – PPO | Source: Ambulatory Visit | Attending: Family Medicine | Admitting: Family Medicine

## 2021-02-04 ENCOUNTER — Encounter: Payer: Self-pay | Admitting: Family Medicine

## 2021-02-04 VITALS — BP 110/78 | HR 90 | Temp 98.2°F | Wt 242.0 lb

## 2021-02-04 DIAGNOSIS — R053 Chronic cough: Secondary | ICD-10-CM

## 2021-02-04 DIAGNOSIS — R918 Other nonspecific abnormal finding of lung field: Secondary | ICD-10-CM | POA: Diagnosis not present

## 2021-02-04 DIAGNOSIS — U099 Post covid-19 condition, unspecified: Secondary | ICD-10-CM

## 2021-02-04 DIAGNOSIS — R059 Cough, unspecified: Secondary | ICD-10-CM | POA: Diagnosis not present

## 2021-02-04 LAB — VITAMIN B12: Vitamin B-12: 523 pg/mL (ref 232–1245)

## 2021-02-04 LAB — FOLATE: Folate: 5.9 ng/mL (ref >3.0)

## 2021-02-04 LAB — VITAMIN D 25 HYDROXY (VIT D DEFICIENCY, FRACTURES): Vit D, 25-Hydroxy: 7 ng/mL — ABNORMAL LOW (ref 30.0–100.0)

## 2021-02-04 MED ORDER — IPRATROPIUM-ALBUTEROL 0.5-2.5 (3) MG/3ML IN SOLN
3.0000 mL | Freq: Once | RESPIRATORY_TRACT | Status: AC
  Administered 2021-02-04: 3 mL via RESPIRATORY_TRACT

## 2021-02-04 NOTE — Addendum Note (Signed)
Addended by: Carola Rhine on: 02/04/2021 04:28 PM   Modules accepted: Orders

## 2021-02-04 NOTE — Progress Notes (Addendum)
   Subjective:    Patient ID: Tammy Delgado, female    DOB: 28-Aug-1971, 50 y.o.   MRN: [id number]  HPI Here to follow up on a dry cough and SOB that have persisted since a Covid infection in January. She had a CXR in March which showed lower bilateral infiltrates, and she was treated with a Zpack. She never really got back to normal, so on 01-19-21 she was given 10 days of Levaquin. This has not helped either. She still has a dry cough that comes and goes, and she gets SOB on exertion. No fever or chest pain. She has never had asthma, and there is no family hx of asthma. Prior to getting the Covid infection she received both doses of Moderna vaccine, but she has not received any boosters. We both noted her low pulse ox level of 90% today.    Review of Systems  Constitutional: Negative.   Respiratory: Positive for cough and shortness of breath. Negative for chest tightness and wheezing.   Cardiovascular: Negative.        Objective:   Physical Exam Constitutional:      Appearance: Normal appearance.  Cardiovascular:     Rate and Rhythm: Normal rate and regular rhythm.     Pulses: Normal pulses.     Heart sounds: Normal heart sounds.  Pulmonary:     Effort: Pulmonary effort is normal. No respiratory distress.     Breath sounds: Normal breath sounds. No stridor. No wheezing, rhonchi or rales.  Neurological:     Mental Status: She is alert.           Assessment & Plan:  Covid-19 long hauler with chronic cough, also with hypoxia. She was given a nebulization treatment with Duoneb today, and she did have some improvement with her breathing. We will send her for another CXR today. Check labs. Refer to Pulmonary asap for further workup. We spent 36 minutes discussing these issues today.  Gershon Crane, MD

## 2021-02-04 NOTE — Addendum Note (Signed)
Addended by: Evert Kohl D on: 02/04/2021 02:36 PM   Modules accepted: Orders

## 2021-02-05 ENCOUNTER — Other Ambulatory Visit: Payer: Self-pay

## 2021-02-05 ENCOUNTER — Telehealth: Payer: Self-pay | Admitting: *Deleted

## 2021-02-05 LAB — SARS-COV-2 ANTIBODY(IGG)SPIKE,SEMI-QUANTITATIVE: SARS COV1 AB(IGG)SPIKE,SEMI QN: 108.93 index — ABNORMAL HIGH (ref <1.00)

## 2021-02-05 LAB — CBC WITH DIFFERENTIAL/PLATELET
Basophils Absolute: 0.1 10*3/uL (ref 0.0–0.1)
Basophils Relative: 1.1 % (ref 0.0–3.0)
Eosinophils Absolute: 0.3 10*3/uL (ref 0.0–0.7)
Eosinophils Relative: 2.8 % (ref 0.0–5.0)
HCT: 38.1 % (ref 36.0–46.0)
Hemoglobin: 12.8 g/dL (ref 12.0–15.0)
Lymphocytes Relative: 38.4 % (ref 12.0–46.0)
Lymphs Abs: 3.6 10*3/uL (ref 0.7–4.0)
MCHC: 33.7 g/dL (ref 30.0–36.0)
MCV: 89.3 fl (ref 78.0–100.0)
Monocytes Absolute: 0.8 10*3/uL (ref 0.1–1.0)
Monocytes Relative: 8.3 % (ref 3.0–12.0)
Neutro Abs: 4.6 10*3/uL (ref 1.4–7.7)
Neutrophils Relative %: 49.4 % (ref 43.0–77.0)
Platelets: 299 10*3/uL (ref 150.0–400.0)
RBC: 4.26 Mil/uL (ref 3.87–5.11)
RDW: 14.2 % (ref 11.5–15.5)
WBC: 9.2 10*3/uL (ref 4.0–10.5)

## 2021-02-05 LAB — TSH: TSH: 2.47 u[IU]/mL (ref 0.35–4.50)

## 2021-02-05 LAB — T4, FREE: Free T4: 0.96 ng/dL (ref 0.60–1.60)

## 2021-02-05 LAB — T3, FREE: T3, Free: 3 pg/mL (ref 2.3–4.2)

## 2021-02-05 MED ORDER — AMOXICILLIN-POT CLAVULANATE 875-125 MG PO TABS: ORAL_TABLET | ORAL | 0 refills | Status: DC

## 2021-02-05 NOTE — Telephone Encounter (Signed)
Prior Auth started for [employer] Key: [id number]

## 2021-02-05 NOTE — Telephone Encounter (Signed)
Noted, pt will be notified when approved

## 2021-02-06 LAB — BASIC METABOLIC PANEL
BUN: 12 mg/dL (ref 6–23)
CO2: 23 mEq/L (ref 19–32)
Calcium: 9.4 mg/dL (ref 8.4–10.5)
Chloride: 102 mEq/L (ref 96–112)
Creatinine, Ser: 0.86 mg/dL (ref 0.40–1.20)
GFR: 79.28 mL/min (ref >60.00)
Glucose, Bld: 135 mg/dL — ABNORMAL HIGH (ref 70–99)
Potassium: 4 mEq/L (ref 3.5–5.1)
Sodium: 140 mEq/L (ref 135–145)

## 2021-02-06 LAB — HEPATIC FUNCTION PANEL
ALT: 12 U/L (ref 0–35)
AST: 16 U/L (ref 0–37)
Albumin: 4.1 g/dL (ref 3.5–5.2)
Alkaline Phosphatase: 94 U/L (ref 39–117)
Bilirubin, Direct: 0.2 mg/dL (ref 0.0–0.3)
Total Bilirubin: 1.4 mg/dL — ABNORMAL HIGH (ref 0.2–1.2)
Total Protein: 7.5 g/dL (ref 6.0–8.3)

## 2021-02-11 NOTE — Progress Notes (Signed)
Dear Dr. Clent Ridges,   Thank you for referring Tammy Delgado to our clinic. The following note includes my evaluation and treatment recommendations.  Chief Complaint:   OBESITY [name] (MR# [id number]) is a 50 y.o. female who presents for evaluation and treatment of obesity and related comorbidities. Current BMI is Body mass index is 40.44 kg/m. Tammy Delgado has been struggling with her weight for many years and has been unsuccessful in either losing weight, maintaining weight loss, or reaching her healthy weight goal.  Tammy Delgado is currently in the action stage of change and ready to dedicate time achieving and maintaining a healthier weight. Tammy Delgado is interested in becoming our patient and working on intensive lifestyle modifications including (but not limited to) diet and exercise for weight loss.  Tammy Delgado is married to her husband, Tammy Delgado, and their daughter lives with them.  She is a Science writer.  She has never tried meal plans/diets in the past.  Dislikes chicken, eggs, [place].  Craves candy and soda.  Eats out 4 days per week.  Snacks on chips.  Skips breakfast.  She is here with her daughter and husband.  Both of them are becoming patients as well.  Tammy Delgado had labs done less than 2 months ago at her PCP's office.  Tammy Delgado's habits were reviewed today and are as follows: Her family eats meals together, she thinks her family will eat healthier with her, her desired weight loss is 80 pounds, she started gaining weight in college, her heaviest weight ever was 260 pounds, she is a picky eater and doesn't like to eat healthier foods, she craves soda and Mr. Lorie Apley, she skips breakfast frequently, she is frequently drinking liquids with calories, she frequently makes poor food choices and she struggles with emotional eating.  Depression Screen Tammy Delgado's Food and Mood (modified PHQ-9) score was 4.  Depression screen [facility] 2/9 02/03/2021  Decreased Interest 1  Down, Depressed, Hopeless 0  PHQ - 2  Score 1  Altered sleeping 1  Tired, decreased energy 1  Change in appetite 1  Feeling bad or failure about yourself  0  Trouble concentrating 0  Moving slowly or fidgety/restless 0  Suicidal thoughts 0  PHQ-9 Score 4  Difficult doing work/chores Not difficult at all   Assessment/Plan:   Orders Placed This Encounter  Procedures   Vitamin B12   Folate   VITAMIN D 25 Hydroxy (Vit-D Deficiency, Fractures)   EKG 12-Lead   Medications Discontinued During This Encounter  Medication Reason   methocarbamol (ROBAXIN-750) 750 MG tablet    nystatin (MYCOSTATIN) 100000 UNIT/ML suspension    ibuprofen (ADVIL) 800 MG tablet    phentermine (ADIPEX-P) 37.5 MG tablet     1. Other fatigue Tammy Delgado admits to daytime somnolence and denies waking up still tired. Patent has a history of symptoms of daytime fatigue and snoring. Tammy Delgado generally gets about 6-8  hours of sleep per night, and states that she has generally restful sleep. Snoring is present. Apneic episodes are not present. Epworth Sleepiness Score is 7.  Tammy Delgado does feel that her weight is causing her energy to be lower than it should be. Fatigue may be related to obesity, depression or many other causes. Labs will be ordered, and in the meanwhile, Tammy Delgado will focus on self care including making healthy food choices, increasing physical activity and focusing on stress reduction.  Will check EKG and labs today.  - EKG 12-Lead - Vitamin B12 - Folate - VITAMIN D 25 Hydroxy (Vit-D Deficiency,  Fractures)  2. SOBOE (shortness of breath on exertion) Tammy Delgado notes increasing shortness of breath with exercising and seems to be worsening over time with weight gain. She notes getting out of breath sooner with activity than she used to. This has gotten worse recently. Tammy Delgado denies shortness of breath at rest or orthopnea.  Tammy Delgado does feel that she gets out of breath more easily that she used to when she exercises. Tammy Delgado's shortness of breath appears  to be obesity related and exercise induced. She has agreed to work on weight loss and gradually increase exercise to treat her exercise induced shortness of breath. Will continue to monitor closely.  Will check IC and labs today.  - Vitamin B12 - Folate - VITAMIN D 25 Hydroxy (Vit-D Deficiency, Fractures)  3. Type 2 diabetes mellitus with other specified complication, with long-term current use of insulin (HCC) Diabetes Mellitus: Not at goal. Medication: Novolog 70/30, metformin 1,000 mg twice daily, Ozempic 0.5 mg subcutaneously weekly (not started yet). Issues reviewed: blood sugar goals, complications of diabetes mellitus, hypoglycemia prevention and treatment, exercise, and nutrition.  Recently referred to Dr. Everardo All of Endocrinology.  Her A1c has been over 9.5 for 5+ years now.  She has not started Ozempic yet.  Recently approved.  FBS 140s.  Plan:  Very poorly controlled.  Education done.  Endocrinology started her on Ozempic.  She was told to start the medication.  Start prudent nutritional plan and eventually exercise. The importance of regular follow up with PCP and all other specialists as scheduled was stressed to patient today. The patient will continue to focus on protein-rich, low simple carbohydrate foods. We reviewed the importance of hydration, regular exercise for stress reduction, and restorative sleep.   Lab Results  Component Value Date   HGBA1C 10.0 (H) 12/03/2020   HGBA1C 9.4 (A) 11/27/2020   HGBA1C 10.4 (H) 04/25/2020   Lab Results  Component Value Date   MICROALBUR <0.7 09/27/2017   LDLCALC 119 (H) 12/03/2020   CREATININE 0.86 02/04/2021   4. Snoring, nightly ESS is 7.  She wakes feeling refreshed.  Denies issues with OSA or gasping for air, etc.  Plan:  Will monitor symptoms as she loses weight.  5. Other disorder of eating, with emotional eating Not at goal. Medication: None.  Eats when bored or stressed, but denies it is an issue of her.  Denies depression or  anxiety.  Plan:  Made her aware of Dr. Karmen Stabs skills.  She will let us know in the future if she would like a referral.  Behavior modification techniques were discussed today to help deal with emotional/non-hunger eating behaviors.  6. Depression screening Adelaida was screened for depression as part of her new patient workup today.  PHQ-9 is 4.  Depression screen is negative.  7. At risk for heart disease Due to Karrina's poorly controlled diabetes, she is at a higher risk for heart disease.  This puts the patient at much greater risk to subsequently develop cardiopulmonary conditions that can significantly affect patient's quality of life in a negative manner.    At least 23 minutes were spent on counseling Janit about these concerns today, and I stressed the importance of reversing risks factors of obesity, especially truncal and visceral fat, hypertension, hyperlipidemia, and pre-diabetes.  The initial goal is to lose at least 5-10% of starting weight to help reduce these risk factors.  Counseling:  Intensive lifestyle modifications were discussed with Nessie as the most appropriate first line of treatment.  she will  continue to work on diet, exercise, and weight loss efforts.  We will continue to reassess these conditions on a fairly regular basis in an attempt to decrease the patient's overall morbidity and mortality.  Evidence-based interventions for health behavior change were utilized today including the discussion of self monitoring techniques, problem-solving barriers, and SMART goal setting techniques.  Specifically, regarding patient's less desirable eating habits and patterns, we employed the technique of small changes when Gaynor has not been able to fully commit to her prudent nutritional plan.  8. Class 3 severe obesity with serious comorbidity and body mass index (BMI) of 40.0 to 44.9 in adult, unspecified obesity type (HCC)  Angi is currently in the action stage of change and  her goal is to continue with weight loss efforts. I recommend Shaylen begin the structured treatment plan as follows:  She has agreed to the Category 2 Plan.  Exercise goals:  As is.    Behavioral modification strategies: increasing lean protein intake, decreasing simple carbohydrates, decreasing liquid calories, meal planning and cooking strategies, avoiding temptations and planning for success.  She was informed of the importance of frequent follow-up visits to maximize her success with intensive lifestyle modifications for her multiple health conditions. She was informed we would discuss her lab results at her next visit unless there is a critical issue that needs to be addressed sooner. Berlie agreed to keep her next visit at the agreed upon time to discuss these results.  Objective:   Blood pressure 106/71, pulse 80, temperature 97.8 F (36.6 C), height 5\' 5"  (1.651 m), weight 243 lb (110.2 kg), last menstrual period 01/25/2021, SpO2 93 %. Body mass index is 40.44 kg/m.  EKG: Normal sinus rhythm, rate 80 bpm.  Indirect Calorimeter completed today shows a VO2 of 265 and a REE of 1841.  Her calculated basal metabolic rate is 01/27/2021 thus her basal metabolic rate is better than expected.  General: Cooperative, alert, well developed, in no acute distress. HEENT: Conjunctivae and lids unremarkable. Cardiovascular: Regular rhythm.  Lungs: Normal work of breathing. Neurologic: No focal deficits.   Lab Results  Component Value Date   CREATININE 0.86 02/04/2021   BUN 12 02/04/2021   NA 140 02/04/2021   K 4.0 02/04/2021   CL 102 02/04/2021   CO2 23 02/04/2021   Lab Results  Component Value Date   ALT 12 02/04/2021   AST 16 02/04/2021   ALKPHOS 94 02/04/2021   BILITOT 1.4 (H) 02/04/2021   Lab Results  Component Value Date   HGBA1C 10.0 (H) 12/03/2020   HGBA1C 9.4 (A) 11/27/2020   HGBA1C 10.4 (H) 04/25/2020   HGBA1C 9.6 (H) 05/18/2018   HGBA1C 10.5 (H) 09/27/2017   Lab Results   Component Value Date   TSH 2.47 02/04/2021   Lab Results  Component Value Date   CHOL 180 12/03/2020   HDL 47.10 12/03/2020   LDLCALC 119 (H) 12/03/2020   TRIG 72.0 12/03/2020   CHOLHDL 4 12/03/2020   Lab Results  Component Value Date   WBC 9.2 02/04/2021   HGB 12.8 02/04/2021   HCT 38.1 02/04/2021   MCV 89.3 02/04/2021   PLT 299.0 02/04/2021   Attestation Statements:   This is the patient's first visit at [facility]. The patient's NEW PATIENT PACKET was reviewed at length. Included in the packet: current and past health history, medications, allergies, ROS, gynecologic history (women only), surgical history, family history, social history, weight history, weight loss surgery history (for those that have  had weight loss surgery), nutritional evaluation, mood and food questionnaire, PHQ9, Epworth questionnaire, sleep habits questionnaire, patient life and health improvement goals questionnaire. These will all be scanned into the patient's chart under media.   During the visit, I independently reviewed the patient's EKG, bioimpedance scale results, and indirect calorimeter results. I used this information to tailor a meal plan for the patient that will help her to lose weight and will improve her obesity-related conditions going forward. I performed a medically necessary appropriate examination and/or evaluation. I discussed the assessment and treatment plan with the patient. The patient was provided an opportunity to ask questions and all were answered. The patient agreed with the plan and demonstrated an understanding of the instructions. Labs were ordered at this visit and will be reviewed at the next visit unless more critical results need to be addressed immediately. Clinical information was updated and documented in the EMR.   I, Insurance claims handlerAmber Agner, CMA, am acting as Energy managertranscriptionist for Marsh & McLennanDeborah Markia Kyer, DO.  I have reviewed the above documentation for accuracy and  completeness, and I agree with the above. Carlye Grippe- Nikki Rusnak J Sheritta Deeg, D.O.  The 21st Century Cures Act was signed into law in 2016 which includes the topic of electronic health records.  This provides immediate access to information in MyChart.  This includes consultation notes, operative notes, office notes, lab results and pathology reports.  If you have any questions about what you read please let us know at your next visit so we can discuss your concerns and take corrective action if need be.  We are right here with you.

## 2021-02-16 ENCOUNTER — Encounter: Payer: Self-pay | Admitting: Internal Medicine

## 2021-02-16 ENCOUNTER — Other Ambulatory Visit: Payer: Self-pay

## 2021-02-16 ENCOUNTER — Ambulatory Visit: Payer: BC Managed Care – PPO | Admitting: Internal Medicine

## 2021-02-16 VITALS — BP 128/74 | HR 83 | Ht 65.0 in | Wt 242.0 lb

## 2021-02-16 DIAGNOSIS — Z8616 Personal history of COVID-19: Secondary | ICD-10-CM

## 2021-02-16 DIAGNOSIS — R0600 Dyspnea, unspecified: Secondary | ICD-10-CM | POA: Diagnosis not present

## 2021-02-16 DIAGNOSIS — R9389 Abnormal findings on diagnostic imaging of other specified body structures: Secondary | ICD-10-CM | POA: Diagnosis not present

## 2021-02-16 DIAGNOSIS — R0609 Other forms of dyspnea: Secondary | ICD-10-CM

## 2021-02-16 DIAGNOSIS — R053 Chronic cough: Secondary | ICD-10-CM

## 2021-02-16 DIAGNOSIS — R0902 Hypoxemia: Secondary | ICD-10-CM

## 2021-02-16 DIAGNOSIS — R768 Other specified abnormal immunological findings in serum: Secondary | ICD-10-CM | POA: Diagnosis not present

## 2021-02-16 LAB — SEDIMENTATION RATE: Sed Rate: 23 mm/hr — ABNORMAL HIGH (ref 0–20)

## 2021-02-16 NOTE — Patient Instructions (Addendum)
ICD-10-CM   1. History of 2019 novel coronavirus disease (COVID-19)  Z86.16     2. COVID-19 virus IgG antibody detected  R76.8     3. Abnormal chest x-ray  R93.89     4. Dyspnea on exertion  R06.00     5. Chronic cough  R05.3     6. Exercise hypoxemia  R09.02       - I am concerned you  have Interstitial Lung Disease (ILD) that is post covid  -  There could be other varieties too - To narrow down possibilities and assess severity please do the following tests  - do ILD Questioonaire at home and bring it back at next visit - do full PFT  - - do overnight oxygen test on room air  - do High Resolution CT chest wo contrast - supine and prone, inspiratory and expiratory images (only Dr Rosario Jacks or Dr Weber Cooks or Dr Polly Cobia or Dr Laqueta Carina to read)  - do autoimmune panel: Serum: ESR, ANA, DS-DNA, RF, anti-CCP, ssA, ssB, scl-70 Total CK,  Aldolase,  Hypersensitivity Pneumonitis Panel  - do other blood work - Quantiferon Gold TB test, D-Dimer  - do echo  Recommend portable o2 but respect your desire to refuse  Followup  - return next few weeks to see Dr Chase Caller or APP to discuss test results; might have to consider steroid course for post covid lung injurty depending on results

## 2021-02-16 NOTE — Progress Notes (Signed)
OV 02/16/2021  Subjective:  Patient ID: Tammy Delgado, female , DOB: 09/27/1970 , age 50 y.o. , MRN: [id number] , ADDRESS: [address] [place] [id number] PCP Laurey Morale, MD Patient Care Team: Laurey Morale, MD as PCP - General  This Provider for this visit: Treatment Team:  Attending Provider: Brand Males, MD    02/16/2021 -   Chief Complaint  Patient presents with   Consult    Pt is being referred due to having a chronic cough after covid diagnosis. Pt was diagnosed with covid January 2022. States her cough is worse at night but states that she does cough occ throughout the day.     HPI Tammy Delgado 50 y.o. -former Marine scientist at [employer].  She had a clear chest x-ray in 2009.  She was previously well.  In January 2022 she suffered from oh micron COVID-19.  This was treated as an outpatient.  She had low-grade fever sore throat and fatigue and sinus complaints.  She says she recovered fully from this in about a week.  Then she was fine for a few weeks.  A month later she started developing insidious onset of shortness of breath and cough.  Since then the cough has improved.  Shortness of breath is present on exertion such as climbing stairs.  It is not present for walking around the house or changing clothes.  She feels she is desaturating.  She does not want oxygen.  Today when we walked her she desaturated easily.  She refused to have oxygen.  The desaturation was sustained.  No associated chest pain.  There is no edema.  No wheezing.  While the cough is improved the shortness of breath is persistent.  There is no orthopnea.  She had a chest x-ray that I personally visualized March 2022 shows bilateral bibasal ILD pattern.  This seems to persist in the June 2022 chest x-ray.  She had a recent COVID IgG antibody and strongly positive.    Simple office walk 185 feet x  3 laps goal with forehead probe 02/16/2021   O2 used ra  Number laps completed 3   Comments about pace avg  Resting Pulse Ox/HR 96% and 78/min  Final Pulse Ox/HR 85% at 1st lap and final pulse ox 82% and 108/min to 115/mikn  Desaturated </= 88% yes  Desaturated <= 3% points yes  Got Tachycardic >/= 90/min yes  Symptoms at end of test Mild dyspnea  Miscellaneous comments Refused o2       CXR 02/04/21 - personally visualized   IMPRESSION: Persistent BILATERAL pulmonary infiltrates consistent with multifocal pneumonia. ([place] to mrch 2022 but new since prior csr I 2009)     Electronically Signed   By: Lavonia Dana M.D.   On: 02/05/2021 09:10   Covid IgG  Results for Tammy, Delgado (MRN [id number]) as of 02/16/2021 10:43  Ref. Range 02/04/2021 14:36  SARS COV1 AB(IGG)SPIKE,SEMI QN Latest Ref Range: <1.00 index 108.93 (H)     has a past medical history of Anemia, Diabetes mellitus without complication ([place]), and SOBOE (shortness of breath on exertion).   reports that she has never smoked. She has never used smokeless tobacco.  Past Surgical History:  Procedure Laterality Date   [place] and 2007    Allergies  Allergen Reactions   Chicken Allergy      There is no immunization history on file for this patient.  Family History  Problem Relation Age of Onset   Diabetes Mother    Hypertension Mother    Cancer Mother    Hypertension Maternal Grandmother    Diabetes Maternal Grandmother    Hypertension Father    Cancer Father      Current Outpatient Medications:    albuterol (PROAIR HFA) 108 (90 Base) MCG/ACT inhaler, Inhale 2 puffs into the lungs every 4 (four) hours as needed for wheezing or shortness of breath., Disp: 18 g, Rfl: 2   amoxicillin-clavulanate (AUGMENTIN) 875-125 MG tablet, Take 1 tablet by mouth twice daily for 10 days, Disp: 20 tablet, Rfl: 0   benzonatate (TESSALON) 200 MG capsule, Take 1 capsule (200 mg total) by mouth every 6 (six) hours as needed for cough., Disp: 60 capsule, Rfl: 0   Continuous Blood Gluc  Sensor (FREESTYLE LIBRE 14 DAY SENSOR) MISC, 1 Device by Does not apply route every 14 (fourteen) days., Disp: 6 each, Rfl: 3   HYDROcodone bit-homatropine (HYCODAN) 5-1.5 MG/5ML syrup, Take 5 mLs by mouth every 4 (four) hours as needed for cough., Disp: 240 mL, Rfl: 0   insulin aspart protamine - aspart (NOVOLOG 70/30 FLEXPEN RELION) (70-30) 100 UNIT/ML FlexPen, 45 units with breakfast, and 20 units with the evening meal, Disp: 60 mL, Rfl: 3   Insulin Pen Needle (BD PEN NEEDLE NANO 2ND GEN) 32G X 4 MM MISC, USE AS DIRECTED TWICE DAILY, Disp: 100 each, Rfl: 3   metFORMIN (GLUCOPHAGE) 1000 MG tablet, Take 1 tablet (1,000 mg total) by mouth 2 (two) times daily with a meal., Disp: 180 tablet, Rfl: 3   Semaglutide,0.25 or 0.5MG/DOS, (OZEMPIC, 0.25 OR 0.5 MG/DOSE,) 2 MG/1.5ML SOPN, Inject 0.5 mg into the skin once a week., Disp: 4.5 mL, Rfl: 3      Objective:   Vitals:   02/16/21 1028  BP: 128/74  Pulse: 83  SpO2: 95%  Weight: 242 lb (109.8 kg)  Height: '5\' 5"'  (1.651 m)    Estimated body mass index is 40.27 kg/m as calculated from the following:   Height as of this encounter: '5\' 5"'  (1.651 m).   Weight as of this encounter: 242 lb (109.8 kg).  '@WEIGHTCHANGE' @  [employer]   02/16/21 1028  Weight: 242 lb (109.8 kg)     Physical Exam  General Appearance:    Alert, cooperative, no distress, appears stated age - yes , Deconditioned looking - no , OBESE  - yes, Sitting on Wheelchair -  no  Head:    Normocephalic, without obvious abnormality, atraumatic  Eyes:    PERRL, conjunctiva/corneas clear,  Ears:    Normal TM's and external ear canals, both ears  Nose:   Nares normal, septum midline, mucosa normal, no drainage    or sinus tenderness. OXYGEN ON  - no . Patient is @ ra   Throat:   Lips, mucosa, and tongue normal; teeth and gums normal. Cyanosis on lips - no  Neck:   Supple, symmetrical, trachea midline, no adenopathy;    thyroid:  no enlargement/tenderness/nodules; no carotid    bruit or JVD  Back:     Symmetric, no curvature, ROM normal, no CVA tenderness  Lungs:     Distress - no , Wheeze no, Barrell Chest - no, Purse lip breathing - no, Crackles - no   Chest Wall:    No tenderness or deformity.    Heart:    Regular rate and rhythm, S1 and S2 normal, no rub   or gallop, Murmur - no  Breast  Exam:    NOT DONE  Abdomen:     Soft, non-tender, bowel sounds active all four quadrants,    no masses, no organomegaly. Visceral obesity - yes  Genitalia:   NOT DONE  Rectal:   NOT DONE  Extremities:   Extremities - normal, Has Cane - no, Clubbing - no, Edema - no  Pulses:   2+ and symmetric all extremities  Skin:   Stigmata of Connective Tissue Disease - no  Lymph nodes:   Cervical, supraclavicular, and axillary nodes normal  Psychiatric:  Neurologic:   Pleasant - yes, Anxious - no, Flat affect - no  CAm-ICU - neg, Alert and Oriented x 3 - yes, Moves all 4s - yes, Speech - normal, Cognition - intact          Assessment:       ICD-10-CM   1. History of 2019 novel coronavirus disease (COVID-19)  Z86.16     2. COVID-19 virus IgG antibody detected  R76.8     3. Abnormal chest x-ray  R93.89     4. Dyspnea on exertion  R06.00     5. Chronic cough  R05.3     6. Exercise hypoxemia  R09.02          Plan:     Patient Instructions     ICD-10-CM   1. History of 2019 novel coronavirus disease (COVID-19)  Z86.16     2. Abnormal chest x-ray  R93.89     3. Dyspnea on exertion  R06.00     4. Chronic cough  R05.3     5. Exercise hypoxemia  R09.02       - I am concerned you  have Interstitial Lung Disease (ILD) that is post covid  -  There could be other varieties too - To narrow down possibilities and assess severity please do the following tests  - do ILD Questioonaire at home and bring it back at next visit - do full PFT  - - do overnight oxygen test on room air  - do High Resolution CT chest wo contrast - supine and prone, inspiratory and expiratory  images (only Dr Rosario Jacks or Dr Weber Cooks or Dr Polly Cobia or Dr Laqueta Carina to read)  - do autoimmune panel: Serum: ESR, ANA, DS-DNA, RF, anti-CCP, ssA, ssB, scl-70 Total CK,  Aldolase,  Hypersensitivity Pneumonitis Panel  - do other blood work - Quantiferon Gold TB test, D-Dimer  Recommend portable o2 but respect your desire to refuse  Followup  - return next few weeks to see Dr Chase Caller or APP to discuss test results; might have to consider steroid course for post covid lung injurty depending on results    SIGNATURE    Dr. Brand Males, M.D., F.C.C.P,  Pulmonary and Critical Care Medicine Staff Physician, [place] Director - Interstitial Lung Disease  Program  Pulmonary [place] at [place], [place], [address]  Pager: [phone], If no answer or between  15:00h - 7:00h: call [phone] Telephone: [phone]  11:08 AM 02/16/2021

## 2021-02-16 NOTE — Addendum Note (Signed)
Addended by: Demetrio Lapping E on: 02/16/2021 11:26 AM   Modules accepted: Orders

## 2021-02-17 ENCOUNTER — Ambulatory Visit (INDEPENDENT_AMBULATORY_CARE_PROVIDER_SITE_OTHER): Payer: BC Managed Care – PPO | Admitting: Family Medicine

## 2021-02-17 ENCOUNTER — Encounter (INDEPENDENT_AMBULATORY_CARE_PROVIDER_SITE_OTHER): Payer: Self-pay | Admitting: Family Medicine

## 2021-02-17 ENCOUNTER — Other Ambulatory Visit: Payer: Self-pay

## 2021-02-17 VITALS — BP 106/71 | HR 84 | Temp 98.3°F | Ht 65.0 in | Wt 237.0 lb

## 2021-02-17 DIAGNOSIS — E1169 Type 2 diabetes mellitus with other specified complication: Secondary | ICD-10-CM

## 2021-02-17 DIAGNOSIS — R0602 Shortness of breath: Secondary | ICD-10-CM | POA: Diagnosis not present

## 2021-02-17 DIAGNOSIS — E559 Vitamin D deficiency, unspecified: Secondary | ICD-10-CM

## 2021-02-17 DIAGNOSIS — E8881 Metabolic syndrome: Secondary | ICD-10-CM | POA: Diagnosis not present

## 2021-02-17 DIAGNOSIS — Z9189 Other specified personal risk factors, not elsewhere classified: Secondary | ICD-10-CM

## 2021-02-17 DIAGNOSIS — Z6841 Body Mass Index (BMI) 40.0 and over, adult: Secondary | ICD-10-CM

## 2021-02-17 DIAGNOSIS — Z68.41 Body mass index (BMI) pediatric, greater than or equal to 95th percentile for age: Secondary | ICD-10-CM | POA: Diagnosis not present

## 2021-02-17 DIAGNOSIS — Z794 Long term (current) use of insulin: Secondary | ICD-10-CM

## 2021-02-17 MED ORDER — VITAMIN D (ERGOCALCIFEROL) 1.25 MG (50000 UNIT) PO CAPS: 50000.0000 [IU] | ORAL_CAPSULE | ORAL | 0 refills | Status: DC

## 2021-02-19 ENCOUNTER — Inpatient Hospital Stay: Admission: RE | Admit: 2021-02-19 | Payer: BC Managed Care – PPO | Source: Ambulatory Visit

## 2021-02-20 LAB — CK TOTAL AND CKMB (NOT AT ARMC): Total CK: 56 U/L (ref 29–143)

## 2021-02-20 LAB — QUANTIFERON-TB GOLD PLUS
Mitogen-NIL: >10 IU/mL
NIL: 0.03 IU/mL
QuantiFERON-TB Gold Plus: NEGATIVE
TB1-NIL: 0.02 IU/mL
TB2-NIL: 0.02 IU/mL

## 2021-02-20 LAB — ANTI-SCLERODERMA ANTIBODY: Scleroderma (Scl-70) (ENA) Antibody, IgG: <1 AI

## 2021-02-20 LAB — D-DIMER, QUANTITATIVE: D-Dimer, Quant: 0.77 mcg/mL FEU — ABNORMAL HIGH (ref <0.50)

## 2021-02-20 LAB — ALDOLASE: Aldolase: 2.7 U/L (ref <=8.1)

## 2021-02-20 LAB — CYCLIC CITRUL PEPTIDE ANTIBODY, IGG: Cyclic Citrullin Peptide Ab: <16 UNITS

## 2021-02-20 LAB — ANA: Anti Nuclear Antibody (ANA): NEGATIVE

## 2021-02-20 LAB — SJOGREN'S SYNDROME ANTIBODS(SSA + SSB)
SSA (Ro) (ENA) Antibody, IgG: <1 AI
SSB (La) (ENA) Antibody, IgG: <1 AI

## 2021-02-20 LAB — HYPERSENSITIVITY PNEUMONITIS
A. Pullulans Abs: NEGATIVE
A.Fumigatus #1 Abs: NEGATIVE
Micropolyspora faeni, IgG: NEGATIVE
Pigeon Serum Abs: NEGATIVE
Thermoact. Saccharii: NEGATIVE
Thermoactinomyces vulgaris, IgG: NEGATIVE

## 2021-02-20 LAB — ANTI-DNA ANTIBODY, DOUBLE-STRANDED: ds DNA Ab: <1 IU/mL

## 2021-02-20 LAB — RHEUMATOID FACTOR: Rheumatoid fact SerPl-aCnc: <14 IU/mL (ref <14)

## 2021-02-24 DIAGNOSIS — U071 COVID-19: Secondary | ICD-10-CM | POA: Diagnosis not present

## 2021-02-24 NOTE — Progress Notes (Signed)
Chief Complaint:   OBESITY Tammy Delgado is here to discuss her progress with her obesity treatment plan along with follow-up of her obesity related diagnoses.   Today's visit was #: 2 Starting weight: 243 lbs Starting date: 02/03/2021 Today's weight: 237 lbs Today's date: 02/17/2021 Weight change since last visit: 6 lbs Total lbs lost to date: 6 lbs Body mass index is 39.44 kg/m.  Total weight loss percentage to date: -2.47%  Interim History:  Tammy Delgado is here today for her first follow-up office visit since starting the program with [place].  All blood work/ lab tests that were recently ordered by myself or an outside provider were reviewed with patient today per their request.   Extended time was spent counseling her on all new disease processes that were discovered or preexisting ones that are worsening.  she understands that many of these abnormalities will need to monitored regularly along with the current treatment plan of prudent dietary changes, in which we are making each and every office visit, to improve these health parameters.  We reviewed her new meal plan in detail and questions were answered.  Patient's food recall appears to be accurate and consistent with what is on plan when she is following it.   When eating on plan, her hunger and cravings are well controlled.    Tammy Delgado did great!  She thought she followed the plan 60% of the time, but it was really 80-90%.  She used her snack calories for fruit.  She is drinking 8 bottles of water per day.  Current Meal Plan: the Category 2 Plan for 80-90% of the time.  Current Exercise Plan: None Current Anti-Obesity Medications: Ozempic 0.5 mg subcutaneously weekly. Side effects: None.   Assessment/Plan:   Meds ordered this encounter  Medications   Vitamin D, Ergocalciferol, (DRISDOL) 1.25 MG (50000 UNIT) CAPS capsule    Sig: Take 1 capsule (50,000 Units total) by mouth every 7 (seven) days.    Dispense:  4 capsule     Refill:  0    30 d; Ov for rf     1. Type 2 diabetes mellitus with other specified complication, with long-term current use of insulin (HCC) Diabetes Mellitus: Not at goal. Medication: metformin 1,000 mg twice daily, Ozempic 0.5 mg subcutaneously weekly, Novolog.  Sees Dr. Everardo All with Endocrinology for management.  FBS 130s, lowest 99, and prior to starting meal plan was 230-250s.  No longer with soda at all.  Started Ozempic 2 weeks ago.  Tolerating well without side effects.  She decreased her insulin to 20 units at lunch only.  No highs.  Issues reviewed: blood sugar goals, complications of diabetes mellitus, hypoglycemia prevention and treatment, exercise, and nutrition.   Plan:  Discussed labs with patient today.  The importance of regular follow up with PCP and all other specialists as scheduled was stressed to patient today. The patient will continue to focus on protein-rich, low simple carbohydrate foods. We reviewed the importance of hydration, regular exercise for stress reduction, and restorative sleep. Recheck A1c 3 months after starting around - August 23.  She declines an increase in her Ozempic dose today.  Continue prudent nutritional plan and weight loss.  Lab Results  Component Value Date   HGBA1C 10.0 (H) 12/03/2020   HGBA1C 9.4 (A) 11/27/2020   HGBA1C 10.4 (H) 04/25/2020   Lab Results  Component Value Date   MICROALBUR <0.7 09/27/2017   LDLCALC 119 (H) 12/03/2020   CREATININE 0.86 02/04/2021  2. Vitamin D deficiency Not at goal. Current vitamin D is 7.0, tested on 02/03/2021. Optimal goal > 50 ng/dL.   Plan:  Discussed labs with patient today.  Start to take prescription Vitamin D @50 ,000 IU every week as prescribed.  Follow-up for routine testing of Vitamin D, at least 2-3 times per year to avoid over-replacement.  - Start Vitamin D, Ergocalciferol, (DRISDOL) 1.25 MG (50000 UNIT) CAPS capsule; Take 1 capsule (50,000 Units total) by mouth every 7 (seven) days.   Dispense: 4 capsule; Refill: 0   3. SOB (shortness of breath) post COVID PCP sent her to Pulmonology recently.  She has an echo scheduled in July.  CT scan of the chest on June 16 and some oxygen monitoring at night.  Plan:  Follow-up with specialists as planned.  Continue prudent nutritional plan and weight loss.   4. At risk for hypoglycemia Tammy Delgado was given approximately 22 minutes of counseling today regarding prevention of hypoglycemia.  She was advised of symptoms of hypoglycemia.  Tammy Delgado was instructed to avoid skipping meals and to eat regular protein-rich meals as prescribed on the meal plan.  The patient should have readily available low calorie snacks as needed, such as [employer] fruit snack packs, if blood sugar goes too low.     5. Class 3 severe obesity with serious comorbidity and body mass index (BMI) of 40.0 to 44.9 in adult, unspecified obesity type (HCC)  Course: Tammy Delgado is currently in the action stage of change. As such, her goal is to continue with weight loss efforts.   Nutrition goals: She has agreed to the Category 2 Plan.   Exercise goals:  As is.  Behavioral modification strategies: increasing lean protein intake, decreasing simple carbohydrates, meal planning and cooking strategies, keeping healthy foods in the home, and planning for success.  Tammy Delgado has agreed to follow-up with our clinic in 2-3 weeks. She was informed of the importance of frequent follow-up visits to maximize her success with intensive lifestyle modifications for her multiple health conditions.    Objective:   Blood pressure 106/71, pulse 84, temperature 98.3 F (36.8 C), height 5\' 5"  (1.651 m), weight 237 lb (107.5 kg), last menstrual period 01/25/2021, SpO2 94 %. Body mass index is 39.44 kg/m.  General: Cooperative, alert, well developed, in no acute distress. HEENT: Conjunctivae and lids unremarkable. Cardiovascular: Regular rhythm.  Lungs: Normal work of breathing. Neurologic: No  focal deficits.   Lab Results  Component Value Date   CREATININE 0.86 02/04/2021   BUN 12 02/04/2021   NA 140 02/04/2021   K 4.0 02/04/2021   CL 102 02/04/2021   CO2 23 02/04/2021   Lab Results  Component Value Date   ALT 12 02/04/2021   AST 16 02/04/2021   ALKPHOS 94 02/04/2021   BILITOT 1.4 (H) 02/04/2021   Lab Results  Component Value Date   HGBA1C 10.0 (H) 12/03/2020   HGBA1C 9.4 (A) 11/27/2020   HGBA1C 10.4 (H) 04/25/2020   HGBA1C 9.6 (H) 05/18/2018   HGBA1C 10.5 (H) 09/27/2017   Lab Results  Component Value Date   TSH 2.47 02/04/2021   Lab Results  Component Value Date   CHOL 180 12/03/2020   HDL 47.10 12/03/2020   LDLCALC 119 (H) 12/03/2020   TRIG 72.0 12/03/2020   CHOLHDL 4 12/03/2020   Lab Results  Component Value Date   WBC 9.2 02/04/2021   HGB 12.8 02/04/2021   HCT 38.1 02/04/2021   MCV 89.3 02/04/2021   PLT 299.0 02/04/2021  Attestation Statements:   Reviewed by clinician on day of visit: allergies, medications, problem list, medical history, surgical history, family history, social history, and previous encounter notes.  I, Insurance claims handler, CMA, am acting as Energy manager for [employer], DO.  I have reviewed the above documentation for accuracy and completeness, and I agree with the above. Carlye Grippe, D.O.  The 21st Century Cures Act was signed into law in 2016 which includes the topic of electronic health records.  This provides immediate access to information in MyChart.  This includes consultation notes, operative notes, office notes, lab results and pathology reports.  If you have any questions about what you read please let us know at your next visit so we can discuss your concerns and take corrective action if need be.  We are right here with you.

## 2021-02-26 ENCOUNTER — Other Ambulatory Visit: Payer: BC Managed Care – PPO

## 2021-03-01 ENCOUNTER — Telehealth: Payer: Self-pay | Admitting: Internal Medicine

## 2021-03-01 NOTE — Telephone Encounter (Signed)
She has post covid desaturation and abnormal CXR. D-dimer marginally high   Plan - do HRCT supine and prone -> same setting do CTA rule out chronic PE

## 2021-03-03 NOTE — Telephone Encounter (Signed)
Attempted to call pt but unable to reach. Unable to leave VM due to mailbox being full.will try to call back later. 

## 2021-03-05 ENCOUNTER — Ambulatory Visit (INDEPENDENT_AMBULATORY_CARE_PROVIDER_SITE_OTHER)
Admission: RE | Admit: 2021-03-05 | Discharge: 2021-03-05 | Disposition: A | Discharge status: Home / Self Care | Payer: BC Managed Care – PPO | Source: Ambulatory Visit | Attending: Internal Medicine | Admitting: Internal Medicine

## 2021-03-05 ENCOUNTER — Other Ambulatory Visit: Payer: Self-pay

## 2021-03-05 DIAGNOSIS — Z8616 Personal history of COVID-19: Secondary | ICD-10-CM | POA: Diagnosis not present

## 2021-03-05 DIAGNOSIS — J849 Interstitial pulmonary disease, unspecified: Secondary | ICD-10-CM | POA: Diagnosis not present

## 2021-03-05 DIAGNOSIS — R0600 Dyspnea, unspecified: Secondary | ICD-10-CM | POA: Diagnosis not present

## 2021-03-05 DIAGNOSIS — R0602 Shortness of breath: Secondary | ICD-10-CM | POA: Diagnosis not present

## 2021-03-05 DIAGNOSIS — J189 Pneumonia, unspecified organism: Secondary | ICD-10-CM | POA: Diagnosis not present

## 2021-03-06 NOTE — Telephone Encounter (Signed)
Called and spoke with pt about recent cxr and labwork and stated to her that MR wanted her to have HRCT to further evaluate. While speaking with pt, saw that HRCT was already performed yesterday 6/30. Stated to her that once results were reviewed that we would let her know what they were and she verbalized understanding. Nothing further needed.

## 2021-03-10 ENCOUNTER — Other Ambulatory Visit (HOSPITAL_COMMUNITY): Payer: BC Managed Care – PPO

## 2021-03-10 ENCOUNTER — Ambulatory Visit (INDEPENDENT_AMBULATORY_CARE_PROVIDER_SITE_OTHER): Payer: BC Managed Care – PPO | Admitting: Family Medicine

## 2021-03-17 ENCOUNTER — Ambulatory Visit (HOSPITAL_COMMUNITY): Payer: BC Managed Care – PPO | Attending: Cardiovascular Disease

## 2021-03-17 ENCOUNTER — Other Ambulatory Visit: Payer: Self-pay

## 2021-03-17 DIAGNOSIS — Z8616 Personal history of COVID-19: Secondary | ICD-10-CM | POA: Insufficient documentation

## 2021-03-17 DIAGNOSIS — R0600 Dyspnea, unspecified: Secondary | ICD-10-CM | POA: Diagnosis not present

## 2021-03-18 LAB — ECHOCARDIOGRAM COMPLETE
Area-P 1/2: 2.97 cm2
S' Lateral: 3 cm

## 2021-03-25 ENCOUNTER — Ambulatory Visit: Payer: BC Managed Care – PPO | Admitting: Endocrinology

## 2021-04-02 ENCOUNTER — Telehealth: Payer: Self-pay | Admitting: Internal Medicine

## 2021-04-02 NOTE — Telephone Encounter (Signed)
Called and spoke with patient. She verbalized understanding. I was able to find an appt with SG on 04/06/21 at 11am. She wishes to take this appt. I have added in the appt notes for her to be qualified for O2 during this visit.   Nothing further needed at time of call.

## 2021-04-02 NOTE — Telephone Encounter (Signed)
The autoimmune panel is negative.  ESR is normal.  Echocardiogram is normal.  But the CT scan of the chest shows ILD.  The ILD could be post-COVID although the radiologist feels the pattern appears somewhat different from post COVID.  Plan - Start oxygen [place] [employer] will start feeling better after the oxygen get started] 2 L nasal cannula -you can try to use the oxygen readings from the most recent office visit but otherwise she might have to come in specifically for documentation   - Keep appointment [facility] which is only less than 2 weeks away  -An option would be to give her 2 to 4 months of steroids but this would require a visit and a discussion before I just start it

## 2021-04-02 NOTE — Telephone Encounter (Signed)
Primary Pulmonologist: Dr. Chase Caller Last office visit and with whom: 02/16/21 with Dr. Chase Caller What do we see them for (pulmonary problems): Hx of Covid-2019, chronic cough, DOE and Abnormal CXR Last OV assessment/plan: see below  Was appointment offered to patient (explain)?     Reason for call: I called and spoke with patient regarding message. She stated she is still staying SOB with exertion and today her O2 stats dropped to 76% and yesterday dropped to 79%. She is not complaining of any other symptoms and is wanting to know the next steps. She is interested in being on oxygen now. She did have her labs, echo and HRCT and would like to know the results and any recs Dr. Chase Caller may have. Patient did not have a follow up appt so made the next available with TP on 04/16/2021 but patient would like to see MR sooner if possible as she is still having so many issues with her breathing. Will route to MR for recs.  Dr. Chase Caller, please advise with test results and O2 stats and future appt. Thanks!   1. History of 2019 novel coronavirus disease (COVID-19) Z86.16       2. COVID-19 virus IgG antibody detected R76.8       3. Abnormal chest x-ray R93.89       4. Dyspnea on exertion R06.00       5. Chronic cough R05.3       6. Exercise hypoxemia R09.02          - I am concerned you  have Interstitial Lung Disease (ILD) that is post covid  -  There could be other varieties too - To narrow down possibilities and assess severity please do the following tests             - do ILD Questioonaire at home and bring it back at next visit - do full PFT             - - do overnight oxygen test on room air             - do High Resolution CT chest wo contrast - supine and prone, inspiratory and expiratory images (only Dr Rosario Jacks or Dr Weber Cooks or Dr Polly Cobia or Dr Laqueta Carina to read)             - do autoimmune panel: Serum: ESR, ANA, DS-DNA, RF, anti-CCP, ssA, ssB, scl-70 Total CK,  Aldolase,   Hypersensitivity Pneumonitis Panel             - do other blood work - Quantiferon Gold TB test, D-Dimer             - do echo   Recommend portable o2 but respect your desire to refuse   Followup  - return next few weeks to see Dr Chase Caller or APP to discuss test results; might have to consider steroid course for post covid lung injurty depending on results        (examples of things to ask: : When did symptoms start? Fever? Cough? Productive? Color to sputum? More sputum than usual? Wheezing? Have you needed increased oxygen? Are you taking your respiratory medications? What over the counter measures have you tried?)  Allergies  Allergen Reactions   Chicken Allergy      There is no immunization history on file for this patient.

## 2021-04-06 ENCOUNTER — Encounter: Payer: Self-pay | Admitting: Acute Care

## 2021-04-06 ENCOUNTER — Ambulatory Visit: Payer: BC Managed Care – PPO | Admitting: Acute Care

## 2021-04-06 ENCOUNTER — Other Ambulatory Visit: Payer: Self-pay

## 2021-04-06 VITALS — BP 122/72 | HR 101 | Temp 98.1°F | Ht 60.0 in | Wt 236.7 lb

## 2021-04-06 DIAGNOSIS — R0602 Shortness of breath: Secondary | ICD-10-CM

## 2021-04-06 DIAGNOSIS — R0902 Hypoxemia: Secondary | ICD-10-CM

## 2021-04-06 MED ORDER — ALBUTEROL SULFATE (2.5 MG/3ML) 0.083% IN NEBU: 2.5000 mg | INHALATION_SOLUTION | Freq: Four times a day (QID) | RESPIRATORY_TRACT | 12 refills | Status: AC | PRN

## 2021-04-06 NOTE — Progress Notes (Signed)
History of Present Illness Tammy Delgado is a 50 y.o. female with dyspnea post Covid. HRCT shows ILD. She is followed by Dr. Chase Caller.    04/06/2021 Pt. Presents for oxygen Qualification . She had Covid 09/2020. She states she had minimal symptoms, but after she recovered she developed dyspnea. CXR at that time showed Persistent BILATERAL pulmonary infiltrates consistent with .multifocal pneumonia.They were slow to resolve. She was referred to Dr. Chase Caller, who did auto immune testing , echo and an HRCT. Results noted below.  Pt. Was originally offered oxygen in June and at that time declined. She did not feel she needed it. She has been monitoring her oxygen saturations and has seen that her oxygen sats are routinely in the 70's with occasions that drop as low as the 60's. She states she does not have very significant dyspnea with these drops, but recognizes she should be wearing  oxygen. She was walked in the office. Initial sat on RA was in the 70's. She was walked and oxygen was titrated to 2 L [place] for sats of 93-94%. She is using IS, and she does have a rescue inhaler that she states really helps with her shortness of breath. She has PFT's ordered but not scheduled.   Test Results: CXR 11/2020 BILATERAL lower lung infiltrates consistent with multifocal pneumonia and history of COVID-19 .  CXR 02/05/2021 Persistent BILATERAL pulmonary infiltrates consistent with multifocal pneumonia.  HRCT 03/06/2021 Extensive, diffuse, geographic ground-glass airspace opacity and septal thickening throughout the lungs ("crazy paving"), with evidence of subpleural sparing. This appearance is unusual for COVID airspace disease although has been reported, but this consideration is not favored given approximately 6 month chronicity of reported COVID infection in January 2022. Other cryptogenic organizing pneumonias and pulmonary alveolar proteinosis, as well as drug toxicity are general differential  considerations.   No evidence of fibrotic interstitial lung disease at this time per radiology, but Ramaswamy feels this is ILD.        The autoimmune panel is negative.  ESR is normal.  Echocardiogram is normal.  But the CT scan of the chest shows ILD.  The ILD could be post-COVID although the radiologist feels the pattern appears somewhat different from post COVID.  PFT's>>> Pending   CBC Latest Ref Rng & Units 02/04/2021 12/03/2020 04/25/2020  WBC 4.0 - 10.5 K/uL 9.2 6.9 7.6  Hemoglobin 12.0 - 15.0 g/dL 12.8 12.9 12.5  Hematocrit 36.0 - 46.0 % 38.1 37.9 38.2  Platelets 150.0 - 400.0 K/uL 299.0 278.0 277    BMP Latest Ref Rng & Units 02/04/2021 12/03/2020 04/25/2020  Glucose 70 - 99 mg/dL 135(H) 247(H) 253(H)  BUN 6 - 23 mg/dL '12 8 8  ' Creatinine 0.40 - 1.20 mg/dL 0.86 0.79 0.80  BUN/Creat Ratio 6 - 22 (calc) - - NOT APPLICABLE  Sodium 115 - 145 mEq/L 140 137 136  Potassium 3.5 - 5.1 mEq/L 4.0 3.9 4.1  Chloride 96 - 112 mEq/L 102 101 101  CO2 19 - 32 mEq/L '23 29 27  ' Calcium 8.4 - 10.5 mg/dL 9.4 9.4 9.0    BNP No results found for: BNP  ProBNP No results found for: PROBNP  PFT No results found for: FEV1PRE, FEV1POST, FVCPRE, FVCPOST, TLC, DLCOUNC, PREFEV1FVCRT, PSTFEV1FVCRT  ECHOCARDIOGRAM COMPLETE  Result Date: 03/18/2021    ECHOCARDIOGRAM REPORT   Patient Name:   Tammy Delgado Date of Exam: 03/17/2021 Medical Rec #:  [id number]        Height:  65.0 in Accession #:    [id number]       Weight:       237.0 lb Date of Birth:  06/04/1971       BSA:          2.126 m Patient Age:    43 years         BP:           106/71 mmHg Patient Gender: F                HR:           72 bpm. Exam Location:  [place] Procedure: 2D Echo, 3D Echo, Cardiac Doppler, Color Doppler and Strain Analysis Indications:    R06.00 Dyspnea  History:        Patient has no prior history of Echocardiogram examinations.                 Signs/Symptoms:Shortness of Breath; Risk Factors:Diabetes.                  History of COVID 19 (January 2022) with Residual SOB.  Sonographer:    Deliah Boston RDCS Referring Phys: [id number] [address]URALI [place]  1. Left ventricular ejection fraction, by estimation, is 60 to 65%. Left ventricular ejection fraction by 3D volume is 63 %. The left ventricle has normal function. The left ventricle has no regional wall motion abnormalities. Left ventricular diastolic  parameters were normal. The average left ventricular global longitudinal strain is 20.2 %. The global longitudinal strain is normal.  2. Right ventricular systolic function is normal. The right ventricular size is normal. Tricuspid regurgitation signal is inadequate for assessing PA pressure.  3. The mitral valve is normal in structure. No evidence of mitral valve regurgitation. No evidence of mitral stenosis.  4. The aortic valve is normal in structure. Aortic valve regurgitation is not visualized. No aortic stenosis is present.  5. The inferior vena cava is dilated in size with >50% respiratory variability, suggesting right atrial pressure of 8 mmHg. FINDINGS  Left Ventricle: Left ventricular ejection fraction, by estimation, is 60 to 65%. Left ventricular ejection fraction by 3D volume is 63 %. The left ventricle has normal function. The left ventricle has no regional wall motion abnormalities. The average left ventricular global longitudinal strain is 20.2 %. The global longitudinal strain is normal. The left ventricular internal cavity size was normal in size. There is no left ventricular hypertrophy. Left ventricular diastolic parameters were normal. Normal left ventricular filling pressure. Right Ventricle: The right ventricular size is normal. No increase in right ventricular wall thickness. Right ventricular systolic function is normal. Tricuspid regurgitation signal is inadequate for assessing PA pressure. Left Atrium: Left atrial size was normal in size. Right Atrium: Right atrial size was normal in size.  Pericardium: There is no evidence of pericardial effusion. Mitral Valve: The mitral valve is normal in structure. No evidence of mitral valve regurgitation. No evidence of mitral valve stenosis. Tricuspid Valve: The tricuspid valve is normal in structure. Tricuspid valve regurgitation is not demonstrated. No evidence of tricuspid stenosis. Aortic Valve: The aortic valve is normal in structure. Aortic valve regurgitation is not visualized. No aortic stenosis is present. Pulmonic Valve: The pulmonic valve was normal in structure. Pulmonic valve regurgitation is not visualized. No evidence of pulmonic stenosis. Aorta: The aortic root is normal in size and structure. Venous: The inferior vena cava is dilated in size with greater than 50% respiratory variability, suggesting right atrial pressure of  8 mmHg. IAS/Shunts: No atrial level shunt detected by color flow Doppler.  LEFT VENTRICLE PLAX 2D LVIDd:         4.23 cm         Diastology LVIDs:         3.00 cm         LV e' medial:    6.85 cm/s LV PW:         1.20 cm         LV E/e' medial:  8.2 LV IVS:        0.95 cm         LV e' lateral:   10.30 cm/s LVOT diam:     2.40 cm         LV E/e' lateral: 5.5 LV SV:         71 LV SV Index:   33              2D LVOT Area:     4.52 cm        Longitudinal                                Strain                                2D Strain GLS  18.5 %                                (A2C):                                2D Strain GLS  18.8 %                                (A3C):                                2D Strain GLS  23.4 %                                (A4C):                                2D Strain GLS  20.2 %                                Avg:                                 3D Volume EF                                LV 3D EF:    Left  ventricular                                             ejection                                             fraction by                                              3D volume                                             is 63 %.                                 3D Volume EF:                                3D EF:        63 %                                LV EDV:       106 ml                                LV ESV:       39 ml                                LV SV:        67 ml RIGHT VENTRICLE RV S prime:     12.70 cm/s TAPSE (M-mode): 2.1 cm LEFT ATRIUM             Index       RIGHT ATRIUM           Index LA diam:        4.00 cm 1.88 cm/m  RA Area:     12.40 cm LA Vol (A2C):   41.7 ml 19.61 ml/m RA Volume:   24.60 ml  11.57 ml/m LA Vol (A4C):   48.7 ml 22.91 ml/m LA Biplane Vol: 47.2 ml 22.20 ml/m  AORTIC VALVE LVOT Vmax:   82.90 cm/s LVOT Vmean:  52.100 cm/s LVOT VTI:    0.157 m  AORTA Ao Root diam: 3.40 cm Ao Asc diam:  3.30 cm MITRAL VALVE MV Area (PHT)  cm         SHUNTS MV Decel Time: 255 msec    Systemic VTI:  0.16 m MV E velocity: 56.15 cm/s  Systemic Diam: 2.40 cm MV A velocity: 55.70 cm/s MV E/A ratio:  1.01 Mihai Croitoru MD Electronically signed by Sanda Klein MD Signature Date/Time: 03/18/2021/11:03:14 AM    Final      Past medical hx  Past Medical History:  Diagnosis Date   Anemia    Diabetes mellitus without complication (HCC)    SOBOE (shortness of breath on exertion)      Social History   Tobacco Use   Smoking status: Never   Smokeless tobacco: Never  Vaping Use   Vaping Use: Never used  Substance Use Topics   Alcohol use: No   Drug use: No    Tammy Delgado reports that she has never smoked. She has never used smokeless tobacco. She reports that she does not drink alcohol and does not use drugs.  Tobacco Cessation: Never smoker   Past surgical hx, Family hx, Social hx all reviewed.  Current Outpatient Medications on File Prior to Visit  Medication Sig   albuterol (PROAIR HFA) 108 (90 Base) MCG/ACT inhaler Inhale 2 puffs into the lungs every 4 (four) hours as needed for wheezing or shortness of breath.    Continuous Blood Gluc Sensor (FREESTYLE LIBRE 14 DAY SENSOR) MISC 1 Device by Does not apply route every 14 (fourteen) days.   insulin aspart protamine - aspart (NOVOLOG 70/30 FLEXPEN RELION) (70-30) 100 UNIT/ML FlexPen 45 units with breakfast, and 20 units with the evening meal   Insulin Pen Needle (BD PEN NEEDLE NANO 2ND GEN) 32G X 4 MM MISC USE AS DIRECTED TWICE DAILY   metFORMIN (GLUCOPHAGE) 1000 MG tablet Take 1 tablet (1,000 mg total) by mouth 2 (two) times daily with a meal.   Semaglutide,0.25 or 0.5MG/DOS, (OZEMPIC, 0.25 OR 0.5 MG/DOSE,) 2 MG/1.5ML SOPN Inject 0.5 mg into the skin once a week.   Vitamin D, Ergocalciferol, (DRISDOL) 1.25 MG (50000 UNIT) CAPS capsule Take 1 capsule (50,000 Units total) by mouth every 7 (seven) days.   No current facility-administered medications on file prior to visit.     Allergies  Allergen Reactions   Chicken Allergy     Review Of Systems:  Constitutional:   No  weight loss, night sweats,  Fevers, chills, fatigue, or  lassitude.  HEENT:   No headaches,  Difficulty swallowing,  Tooth/dental problems, or  Sore throat,                No sneezing, itching, ear ache, nasal congestion, post nasal drip,   CV:  No chest pain,  Orthopnea, PND, swelling in lower extremities, anasarca, dizziness, palpitations, syncope.   GI  No heartburn, indigestion, abdominal pain, nausea, vomiting, diarrhea, change in bowel habits, loss of appetite, bloody stools.   Resp: + shortness of breath with exertion none  at rest.  No excess mucus, no productive cough,  No non-productive cough,  No coughing up of blood.  No change in color of mucus.  No wheezing.  No chest wall deformity  Skin: no rash or lesions.  GU: no dysuria, change in color of urine, no urgency or frequency.  No flank pain, no hematuria   MS:  No joint pain or swelling.  No decreased range of motion.  No back pain.  Psych:  No change in mood or affect. No depression or anxiety.  No memory  loss.   Vital Signs BP 122/72 (BP Location: Left Wrist, Cuff Size: Normal)   Pulse (!) 101   Temp 98.1 F (36.7 C) (Oral)   Ht 5' (1.524 m)   Wt 236 lb 11.2 oz (107.4 kg)   SpO2 93%   BMI 46.23 kg/m    Physical Exam:  General- No distress,  A&Ox3, pleasant ENT: No sinus tenderness, TM clear, pale nasal mucosa, no  oral exudate,no post nasal drip, no LAN Cardiac: S1, S2, regular rate and rhythm, no murmur Chest: No wheeze/ rales/ dullness; no accessory muscle use, no nasal flaring, no sternal retractions Abd.: Soft Non-tender, ND, BS +, Body mass index is 46.23 kg/m. Ext: No clubbing cyanosis, edema Neuro:  normal strength, MAE x 4, A&O x 3 Skin: No rashes, warm and dry, No lesions Psych: normal mood and behavior   Assessment/Plan Post Covid 19 Dyspnea with hypoxemia/ ILD Oxygen saturations dropping to 60's on RA Plan You qualified for oxygen use. We will order oxygen through a DME Please wear your oxygen at 2 L [place]  Saturation goals are > 88% at all times. Wear 24/7, can take it off if sitting and your sats are > 88% We will order a neb machine . We will order albuterol nebs Use as needed for shortness of breath or wheezing , no more than 3 times daily. Add flutter valve to you IS. We will add Mucinex 1200 mg daily for mucus thinning Take with a full glass of water.  Resume Healthy weight and wellness as you had been doing. Weight loss can help with breathlessness.  We will schedule you PFT's. This will give [place] some valuable information.  Follow up in 1 month with Judson Roch NP or Dr. Chase Caller Call if you need [place] sooner. We will call you with the results of your overnight oximetry.  Please contact office for sooner follow up if symptoms do not improve or worsen or seek emergency care       Magdalen Spatz, NP 04/06/2021  11:17 AM

## 2021-04-06 NOTE — Patient Instructions (Signed)
It is good to see you today. You qualified for oxygen use. We will order oxygen through a DME Please wear your oxygen at 2 L [place]  Saturation goals are > 88% at all times. Wear 24/7, can take it off if sitting and your sats are > 88% We will order a neb machine . We will order albuterol nebs Use as needed for shortness of breath or wheezing , no more than 3 times daily. Add flutter valve to you IS. We will add Mucinex 1200 mg daily for mucus thinning Take with a full glass of water.  Resume Healthy weight and wellness as you had been doing. We will schedule you PFT's. This will give [place] some valuable information.  Follow up in 1 month with Maralyn Sago NP or Dr. Marchelle Gearing Call if you need [place] sooner. We will call you with the results of your overnight oximetry.  Please contact office for sooner follow up if symptoms do not improve or worsen or seek emergency care

## 2021-04-08 ENCOUNTER — Ambulatory Visit (INDEPENDENT_AMBULATORY_CARE_PROVIDER_SITE_OTHER): Payer: BC Managed Care – PPO | Admitting: Internal Medicine

## 2021-04-08 ENCOUNTER — Other Ambulatory Visit: Payer: Self-pay

## 2021-04-08 DIAGNOSIS — Z8616 Personal history of COVID-19: Secondary | ICD-10-CM

## 2021-04-08 DIAGNOSIS — R0602 Shortness of breath: Secondary | ICD-10-CM | POA: Diagnosis not present

## 2021-04-08 LAB — PULMONARY FUNCTION TEST
DL/VA % pred: 83 %
DL/VA: 3.73 ml/min/mmHg/L
DLCO cor % pred: 52 %
DLCO cor: 9.62 ml/min/mmHg
DLCO unc % pred: 52 %
DLCO unc: 9.62 ml/min/mmHg
FEF 25-75 Post: 2.65 L/sec
FEF 25-75 Pre: 2.49 L/sec
FEF2575-%Change-Post: 6 %
FEF2575-%Pred-Post: 119 %
FEF2575-%Pred-Pre: 112 %
FEV1-%Change-Post: 36 %
FEV1-%Pred-Post: 86 %
FEV1-%Pred-Pre: 63 %
FEV1-Post: 1.73 L
FEV1-Pre: 1.27 L
FEV1FVC-%Change-Post: -9 %
FEV1FVC-%Pred-Pre: 121 %
FEV6-%Change-Post: 53 %
FEV6-%Pred-Post: 80 %
FEV6-%Pred-Pre: 52 %
FEV6-Post: 1.94 L
FEV6-Pre: 1.27 L
FEV6FVC-%Pred-Post: 103 %
FEV6FVC-%Pred-Pre: 103 %
FVC-%Change-Post: 51 %
FVC-%Pred-Post: 78 %
FVC-%Pred-Pre: 51 %
FVC-Post: 1.94 L
FVC-Pre: 1.28 L
Post FEV1/FVC ratio: 89 %
Post FEV6/FVC ratio: 100 %
Pre FEV1/FVC ratio: 99 %
Pre FEV6/FVC Ratio: 100 %
RV % pred: 60 %
RV: 0.95 L
TLC % pred: 65 %
TLC: 2.93 L

## 2021-04-08 NOTE — Patient Instructions (Signed)
Full PFT performed today. °

## 2021-04-08 NOTE — Progress Notes (Signed)
Full PFT performed today. °

## 2021-04-16 ENCOUNTER — Ambulatory Visit: Payer: BC Managed Care – PPO | Admitting: Adult Health

## 2021-04-21 ENCOUNTER — Ambulatory Visit: Payer: BC Managed Care – PPO | Admitting: Endocrinology

## 2021-05-04 ENCOUNTER — Other Ambulatory Visit: Payer: Self-pay

## 2021-05-04 ENCOUNTER — Ambulatory Visit: Payer: BC Managed Care – PPO | Admitting: Endocrinology

## 2021-05-04 VITALS — BP 110/60 | HR 72 | Ht 60.0 in | Wt 236.6 lb

## 2021-05-04 DIAGNOSIS — Z794 Long term (current) use of insulin: Secondary | ICD-10-CM | POA: Diagnosis not present

## 2021-05-04 DIAGNOSIS — E119 Type 2 diabetes mellitus without complications: Secondary | ICD-10-CM

## 2021-05-04 DIAGNOSIS — U071 COVID-19: Secondary | ICD-10-CM | POA: Diagnosis not present

## 2021-05-04 LAB — POCT GLYCOSYLATED HEMOGLOBIN (HGB A1C): Hemoglobin A1C: 8.7 % — AB (ref 4.0–5.6)

## 2021-05-04 MED ORDER — SEMAGLUTIDE (1 MG/DOSE) 4 MG/3ML ~~LOC~~ SOPN: 1.0000 mg | PEN_INJECTOR | SUBCUTANEOUS | 3 refills | Status: DC

## 2021-05-04 MED ORDER — METFORMIN HCL ER 500 MG PO TB24: 2000.0000 mg | ORAL_TABLET | Freq: Every day | ORAL | 3 refills | Status: DC

## 2021-05-04 NOTE — Progress Notes (Signed)
Subjective:    Patient ID: Tammy Delgado, female    DOB: Nov 29, 1970, 50 y.o.   MRN: [id number]  HPI Pt returns for f/u of diabetes mellitus: DM type: Insulin-requiring type 2 Dx'ed: 2014 Complications: none Therapy: insulin since dx GDM: never DKA: never Severe hypoglycemia: never Pancreatitis: never Pancreatic imaging: normal on 2006 CT SDOH: none Other: She works as a Software engineer; she takes BID insulin, at least for now. Interval history: no cbg record, but states cbg's vary from 126-250.  It is in general higher as the day goes on Past Medical History:  Diagnosis Date   Anemia    Diabetes mellitus without complication (HCC)    SOBOE (shortness of breath on exertion)     Past Surgical History:  Procedure Laterality Date   CESAREAN SECTION  1995 and 2007    Social History   Socioeconomic History   Marital status: Married    Spouse name: Bettina Gavia   Number of children: 2   Years of education: Not on file   Highest education level: Not on file  Occupational History   Occupation: Dispatcher  Tobacco Use   Smoking status: Never   Smokeless tobacco: Never  Vaping Use   Vaping Use: Never used  Substance and Sexual Activity   Alcohol use: No   Drug use: No   Sexual activity: Not on file  Other Topics Concern   Not on file  Social History Narrative   Not on file   Social Determinants of Health   Financial Resource Strain: Not on file  Food Insecurity: Not on file  Transportation Needs: Not on file  Physical Activity: Not on file  Stress: Not on file  Social Connections: Not on file  Intimate Partner Violence: Not on file    Current Outpatient Medications on File Prior to Visit  Medication Sig Dispense Refill   albuterol (PROAIR HFA) 108 (90 Base) MCG/ACT inhaler Inhale 2 puffs into the lungs every 4 (four) hours as needed for wheezing or shortness of breath. 18 g 2   albuterol (PROVENTIL) (2.5 MG/3ML) 0.083% nebulizer solution Take 3 mLs (2.5  mg total) by nebulization every 6 (six) hours as needed for wheezing or shortness of breath. 75 mL 12   Continuous Blood Gluc Sensor (FREESTYLE LIBRE 14 DAY SENSOR) MISC 1 Device by Does not apply route every 14 (fourteen) days. 6 each 3   insulin aspart protamine - aspart (NOVOLOG 70/30 FLEXPEN RELION) (70-30) 100 UNIT/ML FlexPen 45 units with breakfast, and 20 units with the evening meal 60 mL 3   Insulin Pen Needle (BD PEN NEEDLE NANO 2ND GEN) 32G X 4 MM MISC USE AS DIRECTED TWICE DAILY 100 each 3   Vitamin D, Ergocalciferol, (DRISDOL) 1.25 MG (50000 UNIT) CAPS capsule Take 1 capsule (50,000 Units total) by mouth every 7 (seven) days. 4 capsule 0   No current facility-administered medications on file prior to visit.    Allergies  Allergen Reactions   Chicken Allergy     Family History  Problem Relation Age of Onset   Diabetes Mother    Hypertension Mother    Cancer Mother    Hypertension Maternal Grandmother    Diabetes Maternal Grandmother    Hypertension Father    Cancer Father     BP 110/60 (BP Location: Right Arm, Patient Position: Sitting, Cuff Size: Large)   Pulse 72   Ht 5' (1.524 m)   Wt 236 lb 9.6 oz (107.3 kg)   SpO2 96%  BMI 46.21 kg/m    Review of Systems Denies n/v/hb    Objective:   Physical Exam Pulses: dorsalis pedis intact bilat.   MSK: no deformity of the feet CV: no leg edema Skin:  no ulcer on the feet.  normal color and temp on the feet. Neuro: sensation is intact to touch on the feet   Lab Results  Component Value Date   HGBA1C 8.7 (A) 05/04/2021      Assessment & Plan:  Insulin-requiring type 2 DM: uncontrolled.   Patient Instructions  check your blood sugar twice a day.  vary the time of day when you check, between before the 3 meals, and at bedtime.  also check if you have symptoms of your blood sugar being too high or too low.  please keep a record of the readings and bring it to your next appointment here (or you can bring the  meter itself).  You can write it on any piece of paper.  please call us sooner if your blood sugar goes below 70, or if most of your readings are over 200.   I have sent 2 prescriptions to your pharmacy, to increase the Ozempic, and to change the metformin to extended-release. Please continue the same insulin.   Please come back for a follow-up appointment in 2 months.

## 2021-05-04 NOTE — Patient Instructions (Addendum)
check your blood sugar twice a day.  vary the time of day when you check, between before the 3 meals, and at bedtime.  also check if you have symptoms of your blood sugar being too high or too low.  please keep a record of the readings and bring it to your next appointment here (or you can bring the meter itself).  You can write it on any piece of paper.  please call us sooner if your blood sugar goes below 70, or if most of your readings are over 200.   I have sent 2 prescriptions to your pharmacy, to increase the Ozempic, and to change the metformin to extended-release. Please continue the same insulin.   Please come back for a follow-up appointment in 2 months.

## 2021-05-05 ENCOUNTER — Ambulatory Visit: Payer: BC Managed Care – PPO | Admitting: Family Medicine

## 2021-05-05 ENCOUNTER — Encounter: Payer: Self-pay | Admitting: Family Medicine

## 2021-05-05 VITALS — BP 124/76 | HR 79 | Temp 98.1°F | Wt 236.0 lb

## 2021-05-05 DIAGNOSIS — F5089 Other specified eating disorder: Secondary | ICD-10-CM | POA: Diagnosis not present

## 2021-05-05 DIAGNOSIS — D649 Anemia, unspecified: Secondary | ICD-10-CM

## 2021-05-05 DIAGNOSIS — Z8616 Personal history of COVID-19: Secondary | ICD-10-CM | POA: Diagnosis not present

## 2021-05-05 DIAGNOSIS — R432 Parageusia: Secondary | ICD-10-CM

## 2021-05-05 LAB — CBC WITH DIFFERENTIAL/PLATELET
Basophils Absolute: 0.1 10*3/uL (ref 0.0–0.1)
Basophils Relative: 1.2 % (ref 0.0–3.0)
Eosinophils Absolute: 0.1 10*3/uL (ref 0.0–0.7)
Eosinophils Relative: 1.7 % (ref 0.0–5.0)
HCT: 35 % — ABNORMAL LOW (ref 36.0–46.0)
Hemoglobin: 11.6 g/dL — ABNORMAL LOW (ref 12.0–15.0)
Lymphocytes Relative: 46.1 % — ABNORMAL HIGH (ref 12.0–46.0)
Lymphs Abs: 3 10*3/uL (ref 0.7–4.0)
MCHC: 33.2 g/dL (ref 30.0–36.0)
MCV: 84.7 fl (ref 78.0–100.0)
Monocytes Absolute: 0.6 10*3/uL (ref 0.1–1.0)
Monocytes Relative: 8.5 % (ref 3.0–12.0)
Neutro Abs: 2.8 10*3/uL (ref 1.4–7.7)
Neutrophils Relative %: 42.5 % — ABNORMAL LOW (ref 43.0–77.0)
Platelets: 288 10*3/uL (ref 150.0–400.0)
RBC: 4.13 Mil/uL (ref 3.87–5.11)
RDW: 16.3 % — ABNORMAL HIGH (ref 11.5–15.5)
WBC: 6.5 10*3/uL (ref 4.0–10.5)

## 2021-05-05 LAB — IBC + FERRITIN
Ferritin: 8.2 ng/mL — ABNORMAL LOW (ref 10.0–291.0)
Iron: 56 ug/dL (ref 42–145)
Saturation Ratios: 14.5 % — ABNORMAL LOW (ref 20.0–50.0)
TIBC: 386.4 ug/dL (ref 250.0–450.0)
Transferrin: 276 mg/dL (ref 212.0–360.0)

## 2021-05-05 LAB — IRON: Iron: 56 ug/dL (ref 42–145)

## 2021-05-05 LAB — VITAMIN B12: Vitamin B-12: 267 pg/mL (ref 211–911)

## 2021-05-05 MED ORDER — FLUCONAZOLE 150 MG PO TABS: 150.0000 mg | ORAL_TABLET | Freq: Every day | ORAL | 11 refills | Status: DC

## 2021-05-05 NOTE — Progress Notes (Signed)
   Subjective:    Patient ID: Tammy Delgado, female    DOB: [birth year], 50 y.o.   MRN: [id number]  HPI Here for the onset one week ago of a loss of taste. No loss of smell. She had a Covid-19 infection in January, and this has created a post-Covid form of ILD. See is seeing Dr. Marchelle Gearing and she uses oxygen at home on an as needed basis. Also one week ago she developed the urge to eat ice, which she has never done before. Her Hgb on 02-04-21 was 12.9. Her A1c yesterday with Dr. Everardo All was 8.7 (down from 10.0 in March).    Review of Systems  Constitutional: Negative.   HENT:  Negative for postnasal drip and sore throat.   Respiratory:  Positive for shortness of breath. Negative for cough and wheezing.   Cardiovascular: Negative.       Objective:   Physical Exam Constitutional:      Appearance: Normal appearance. She is not ill-appearing.  HENT:     Mouth/Throat:     Pharynx: Oropharynx is clear.  Cardiovascular:     Rate and Rhythm: Normal rate and regular rhythm.     Pulses: Normal pulses.     Heart sounds: Normal heart sounds.  Pulmonary:     Effort: Pulmonary effort is normal. No respiratory distress.     Breath sounds: Normal breath sounds. No stridor. No wheezing, rhonchi or rales.  Lymphadenopathy:     Cervical: No cervical adenopathy.  Neurological:     Mental Status: She is alert.          Assessment & Plan:  Recent onset of loss of taste and a craving for ice. Three possible etiologies would be (1) a delayed effect of the Covid virus, (2) a form of diabetic neuropathy, or (3) iron deficiency. We will check a CBC, B12, iron and ferritin today. She will start taking a B complex viatmin daily.  Gershon Crane, MD

## 2021-05-06 ENCOUNTER — Other Ambulatory Visit: Payer: Self-pay

## 2021-05-06 MED ORDER — IRON (FERROUS SULFATE) 325 (65 FE) MG PO TABS: 325.0000 mg | ORAL_TABLET | Freq: Every day | ORAL | 3 refills | Status: DC

## 2021-05-09 DIAGNOSIS — R0602 Shortness of breath: Secondary | ICD-10-CM | POA: Diagnosis not present

## 2021-05-22 ENCOUNTER — Encounter: Payer: Self-pay | Admitting: Internal Medicine

## 2021-05-22 ENCOUNTER — Ambulatory Visit: Payer: BC Managed Care – PPO | Admitting: Internal Medicine

## 2021-05-22 ENCOUNTER — Ambulatory Visit (INDEPENDENT_AMBULATORY_CARE_PROVIDER_SITE_OTHER): Payer: BC Managed Care – PPO

## 2021-05-22 ENCOUNTER — Other Ambulatory Visit: Payer: Self-pay

## 2021-05-22 VITALS — BP 120/74 | HR 75 | Ht 67.0 in | Wt 234.0 lb

## 2021-05-22 DIAGNOSIS — J849 Interstitial pulmonary disease, unspecified: Secondary | ICD-10-CM | POA: Diagnosis not present

## 2021-05-22 DIAGNOSIS — R918 Other nonspecific abnormal finding of lung field: Secondary | ICD-10-CM | POA: Diagnosis not present

## 2021-05-22 DIAGNOSIS — R053 Chronic cough: Secondary | ICD-10-CM | POA: Diagnosis not present

## 2021-05-22 DIAGNOSIS — J189 Pneumonia, unspecified organism: Secondary | ICD-10-CM | POA: Diagnosis not present

## 2021-05-22 DIAGNOSIS — R0902 Hypoxemia: Secondary | ICD-10-CM | POA: Diagnosis not present

## 2021-05-22 DIAGNOSIS — J9611 Chronic respiratory failure with hypoxia: Secondary | ICD-10-CM

## 2021-05-22 DIAGNOSIS — Z8616 Personal history of COVID-19: Secondary | ICD-10-CM

## 2021-05-22 NOTE — Addendum Note (Signed)
Addended by: Demetrio Lapping E on: 05/22/2021 03:57 PM   Modules accepted: Orders

## 2021-05-22 NOTE — Progress Notes (Signed)
OV 02/16/2021  Subjective:  Patient ID: Tammy Delgado, female , DOB: [birth year] , age 50 y.o. , MRN: [id number] , ADDRESS: [address] [place] [address] PCP Tammy Morale, MD Patient Care Team: Tammy Morale, MD as PCP - General  This Provider for this visit: Treatment Team:  Attending Provider: Brand Males, MD    02/16/2021 -   Chief Complaint  Patient presents with   Consult    Pt is being referred due to having a chronic cough after covid diagnosis. Pt was diagnosed with covid January 2022. States her cough is worse at night but states that she does cough occ throughout the day.     HPI Tammy Delgado 50 y.o. -former Marine scientist at [employer].  She had a clear chest x-ray in 2009.  She was previously well.  In January 2022 she suffered from oh micron COVID-19.  This was treated as an outpatient.  She had low-grade fever sore throat and fatigue and sinus complaints.  She says she recovered fully from this in about a week.  Then she was fine for a few weeks.  A month later she started developing insidious onset of shortness of breath and cough.  Since then the cough has improved.  Shortness of breath is present on exertion such as climbing stairs.  It is not present for walking around the house or changing clothes.  She feels she is desaturating.  She does not want oxygen.  Today when we walked her she desaturated easily.  She refused to have oxygen.  The desaturation was sustained.  No associated chest pain.  There is no edema.  No wheezing.  While the cough is improved the shortness of breath is persistent.  There is no orthopnea.  She had a chest x-ray that I personally visualized March 2022 shows bilateral bibasal ILD pattern.  This seems to persist in the June 2022 chest x-ray.  She had a recent COVID IgG antibody and strongly positive.    CXR 02/04/21 - personally visualized   IMPRESSION: Persistent BILATERAL pulmonary infiltrates consistent  with multifocal pneumonia. ([place] to mrch 2022 but new since prior csr I 2009)     Electronically Signed   By: Tammy Delgado M.D.   On: 02/05/2021 09:10   Covid IgG  Results for Tammy Delgado (MRN [id number]) as of 02/16/2021 10:43  Ref. Range 02/04/2021 14:36  SARS COV1 AB(IGG)SPIKE,SEMI QN Latest Ref Range: <1.00 index 108.93 (H)     04/06/2021 Pt. Presents for oxygen Qualification . She had Covid 09/2020. She states she had minimal symptoms, but after she recovered she developed dyspnea. CXR at that time showed Persistent BILATERAL pulmonary infiltrates consistent with .multifocal pneumonia.They were slow to resolve. She was referred to Dr. Chase Caller, who did auto immune testing , echo and an HRCT. Results noted below.  Pt. Was originally offered oxygen in June and at that time declined. She did not feel she needed it. She has been monitoring her oxygen saturations and has seen that her oxygen sats are routinely in the 70's with occasions that drop as low as the 60's. She states she does not have very significant dyspnea with these drops, but recognizes she should be wearing  oxygen. She was walked in the office. Initial sat on RA was in the 70's. She was walked and oxygen was titrated to 2 L [place] for sats of 93-94%. She is using IS, and she does have a rescue inhaler  that she states really helps with her shortness of breath. She has PFT's ordered but not scheduled.   Test Results: CXR 11/2020 BILATERAL lower lung infiltrates consistent with multifocal pneumonia and history of COVID-19 .  CXR 02/05/2021 Persistent BILATERAL pulmonary infiltrates consistent with multifocal pneumonia.  HRCT 03/06/2021 Extensive, diffuse, geographic ground-glass airspace opacity and septal thickening throughout the lungs ("crazy paving"), with evidence of subpleural sparing. This appearance is unusual for COVID airspace disease although has been reported, but this consideration is not favored given  approximately 6 month chronicity of reported COVID infection in January 2022. Other cryptogenic organizing pneumonias and pulmonary alveolar proteinosis, as well as drug toxicity are general differential considerations.   No evidence of fibrotic interstitial lung disease at this time per radiology, but Tammy Delgado feels this is ILD.        The autoimmune panel is negative.  ESR is normal.  Echocardiogram is normal.  But the CT scan of the chest shows ILD.  The ILD could be post-COVID although the radiologist feels the pattern appears somewhat different from post COVID.   OV 05/22/2021  Subjective:  Patient ID: Tammy Delgado, female , DOB: 11-Nov-1970 , age 50 y.o. , MRN: [id number] , ADDRESS: [address] [place] [address] PCP Tammy Morale, MD Patient Care Team: Tammy Morale, MD as PCP - General  This Provider for this visit: Treatment Team:  Attending Provider: Brand Males, MD    05/22/2021 -   Chief Complaint  Patient presents with   Follow-up    Patient reports she is about the same with her breathing.    COVID-19 oh micron in January 2022  Since then chronic hypoxemic respiratory failure requiring oxygen with exertion and CT scan of the chest showing bilateral diffuse groundglass opacities [inconsistent with UIP pattern and also inconsistent with classic post-COVID patent]  -PFTs August 2022 consistent with restriction and qualifying for excess hypoxemia oxygen  HPI Tammy Delgado 50 y.o. -presents for follow-up.  Overall since seeing nurse practitioner she is better.  Nurse practitioner had the patient qualified for oxygen.  She says she not using the oxygen.  She think she is doing everything to get better such as taking deep exercises with incentive spirometer.  She says she feels better.  She subjectively does not use oxygen.  This past week she is not used oxygen even at night.  Sometime back she went to her tennis tournament of her daughters and  she felt tired and then she came back and use oxygen.  She does not check her pulse ox.  Everything is based on subjective needs.  Subjectively she is feeling better.  Current symptom score and walking desaturation test documented below.Is clear she has fair amount of symptoms - walking test shows improvement   SYMPTOM SCALE - ILD 05/22/2021  Current weight 234#  O2 use ra  Shortness of Breath 0 -> 5 scale with 5 being worst (score 6 If unable to do)  At rest 0  Simple tasks - showers, clothes change, eating, shaving 1  Household (dishes, doing bed, laundry) 3  Shopping 2  Walking level at own pace 3  Walking up Stairs 3  Total (30-36) Dyspnea Score 12  How bad is your cough? 0  How bad is your fatigue 1  How bad is nausea 0  How bad is vomiting?  00  How bad is diarrhea? 0  How bad is anxiety? 0  How bad is depression 00  Any chronic  pain - if so where and how bad 0         Simple office walk 185 feet x  3 laps goal with forehead probe 02/16/2021  05/22/2021   O2 used ra ra  Number laps completed 3 2 of 3 on RAM  Comments about pace avg avg  Resting Pulse Ox/HR 96% and 78/min 96% and HR 73  Final Pulse Ox/HR 85% at 1st lap and final pulse ox 82% and 108/min to 115/mikn 90% ad HR 80  Desaturated </= 88% yes no  Desaturated <= 3% points yes no  Got Tachycardic >/= 90/min Yes, 11 points Yes, 6 points  Symptoms at end of test Mild dyspnea dyspnea  Miscellaneous comments Refused o2  improved    PFT  PFT Results Latest Ref Rng & Units 04/08/2021  FVC-Pre L 1.28  FVC-Predicted Pre % 51  FVC-Post L 1.94  FVC-Predicted Post % 78  Pre FEV1/FVC % % 99  Post FEV1/FCV % % 89  FEV1-Pre L 1.27  FEV1-Predicted Pre % 63  FEV1-Post L 1.73  DLCO uncorrected ml/min/mmHg 9.62  DLCO UNC% % 52  DLCO corrected ml/min/mmHg 9.62  DLCO COR %Predicted % 52  DLVA Predicted % 83  TLC L 2.93  TLC % Predicted % 65  RV % Predicted % 60    HRCT 03/05/21  IMPRESSION: 1. Extensive,  diffuse, geographic ground-glass airspace opacity and septal thickening throughout the lungs ("crazy paving"), with evidence of subpleural sparing. This appearance is unusual for COVID airspace disease although has been reported, but this consideration is not favored given approximately 6 month chronicity of reported COVID infection in January 2022. Other cryptogenic organizing pneumonias and pulmonary alveolar proteinosis, as well as drug toxicity are general differential considerations.   2.  No evidence of fibrotic interstitial lung disease at this time.     Electronically Signed   By: Eddie Candle M.D.   On: 03/06/2021 13:24   has a past medical history of Anemia, Diabetes mellitus without complication ([place]), and SOBOE (shortness of breath on exertion).   reports that she has never smoked. She has never used smokeless tobacco.  Past Surgical History:  Procedure Laterality Date   [place] and 2007    Allergies  Allergen Reactions   Chicken Allergy Other (See Comments)    Immunization History  Administered Date(s) Administered   Moderna Sars-Covid-2 Vaccination 11/08/2019, 12/06/2019    Family History  Problem Relation Age of Onset   Diabetes Mother    Hypertension Mother    Cancer Mother    Hypertension Maternal Grandmother    Diabetes Maternal Grandmother    Hypertension Father    Cancer Father      Current Outpatient Medications:    albuterol (PROAIR HFA) 108 (90 Base) MCG/ACT inhaler, Inhale 2 puffs into the lungs every 4 (four) hours as needed for wheezing or shortness of breath., Disp: 18 g, Rfl: 2   albuterol (PROVENTIL) (2.5 MG/3ML) 0.083% nebulizer solution, Take 3 mLs (2.5 mg total) by nebulization every 6 (six) hours as needed for wheezing or shortness of breath., Disp: 75 mL, Rfl: 12   Continuous Blood Gluc Sensor (FREESTYLE LIBRE 14 DAY SENSOR) MISC, 1 Device by Does not apply route every 14 (fourteen) days., Disp: 6 each, Rfl: 3    fluconazole (DIFLUCAN) 150 MG tablet, Take 1 tablet (150 mg total) by mouth daily., Disp: 1 tablet, Rfl: 11   insulin aspart protamine - aspart (NOVOLOG 70/30 FLEXPEN RELION) (70-30) 100 UNIT/ML  FlexPen, 45 units with breakfast, and 20 units with the evening meal, Disp: 60 mL, Rfl: 3   Insulin Pen Needle (BD PEN NEEDLE NANO 2ND GEN) 32G X 4 MM MISC, USE AS DIRECTED TWICE DAILY, Disp: 100 each, Rfl: 3   Iron, Ferrous Sulfate, 325 (65 Fe) MG TABS, Take 325 mg by mouth daily., Disp: 90 tablet, Rfl: 3   metFORMIN (GLUCOPHAGE-XR) 500 MG 24 hr tablet, Take 4 tablets (2,000 mg total) by mouth daily., Disp: 360 tablet, Rfl: 3   Semaglutide, 1 MG/DOSE, 4 MG/3ML SOPN, Inject 1 mg as directed once a week., Disp: 9 mL, Rfl: 3   Vitamin D, Ergocalciferol, (DRISDOL) 1.25 MG (50000 UNIT) CAPS capsule, Take 1 capsule (50,000 Units total) by mouth every 7 (seven) days., Disp: 4 capsule, Rfl: 0      Objective:   Vitals:   05/22/21 1438  BP: 120/74  Pulse: 75  SpO2: 94%  Weight: 234 lb (106.1 kg)  Height: '5\' 7"'  (1.702 m)    Estimated body mass index is 36.65 kg/m as calculated from the following:   Height as of this encounter: '5\' 7"'  (1.702 m).   Weight as of this encounter: 234 lb (106.1 kg).  '@WEIGHTCHANGE' @  [employer]   05/22/21 1438  Weight: 234 lb (106.1 kg)     Physical Exam General: No distress. Obese Neuro: Alert and Oriented x 3. GCS 15. Speech normal Psych: Pleasant Resp:  Barrel Chest - no.  Wheeze - no, Crackles - no, No overt respiratory distress CVS: Normal heart sounds. Murmurs - no Ext: Stigmata of Connective Tissue Disease - no HEENT: Normal upper airway. PEERL +. No post nasal drip        Assessment:       ICD-10-CM   1. History of 2019 novel coronavirus disease (COVID-19)  Z86.16     2. Chronic hypoxemic respiratory failure (HCC)  J96.11     3. Exercise hypoxemia  R09.02     4. Ground glass opacity present on imaging of lung  R91.8     5. ILD (interstitial  lung disease) ([place])  J84.9          Plan:     Patient Instructions     ICD-10-CM   1. History of 2019 novel coronavirus disease (COVID-19)  Z86.16     2. Chronic hypoxemic respiratory failure (HCC)  J96.11     3. Exercise hypoxemia  R09.02     4. Ground glass opacity present on imaging of lung  R91.8     5. ILD (interstitial lung disease) ([place])  J84.9       Walking test and your symptom  profile suggests natural improement Last CXR and CT was June 2022 - both shows diffuse inflammation  [facility] - get CXR 2 view 05/22/2021   - if improveed - we can clinically follow you   - if still abnromal - return sooner with repeat CT to discuss next step such as bronchhoscopy - get covid IgG to track antibiody levels from original covid infection  [facility]oll[place] - 3 months  -15 min slot with Dr Chase Caller -   - sympotom score and walking desat test at followup    SIGNATURE    Dr. Brand Delgado, M.D., F.C.C.P,  Pulmonary and Critical Care Medicine Staff Physician, [place] Director - Interstitial Lung Disease  Program  [facility] [place], [place], [address]  Pager: [phone], If no answer or  between  15:00h - 7:00h: call [phone] Telephone: [phone]  3:42 PM 05/22/2021

## 2021-05-22 NOTE — Patient Instructions (Addendum)
ICD-10-CM   1. History of 2019 novel coronavirus disease (COVID-19)  Z86.16     2. Chronic hypoxemic respiratory failure (HCC)  J96.11     3. Exercise hypoxemia  R09.02     4. Ground glass opacity present on imaging of lung  R91.8     5. ILD (interstitial lung disease) (HCC)  J84.9       Walking test and your symptom  profile suggests natural improement Last CXR and CT was June 2022 - both shows diffuse inflammation  [facility] - get CXR 2 view 05/22/2021   - if improveed - we can clinically follow you   - if still abnromal - return sooner with repeat CT to discuss next step such as bronchhoscopy - get covid IgG to track antibiody levels from original covid infection  Follwoup - 3 months  -15 min slot with Dr Marchelle Gearing -   - sympotom score and walking desat test at followup

## 2021-05-24 LAB — SARS COV-2 SEROLOGY(COVID-19)AB(IGG,IGM),IMMUNOASSAY
SARS CoV-2 AB IgG: POSITIVE — AB
SARS CoV-2 IgM: NEGATIVE

## 2021-06-08 DIAGNOSIS — R0602 Shortness of breath: Secondary | ICD-10-CM | POA: Diagnosis not present

## 2021-07-08 ENCOUNTER — Ambulatory Visit: Payer: BC Managed Care – PPO | Admitting: Endocrinology

## 2021-07-09 DIAGNOSIS — R0602 Shortness of breath: Secondary | ICD-10-CM | POA: Diagnosis not present

## 2021-07-21 ENCOUNTER — Other Ambulatory Visit: Payer: Self-pay

## 2021-07-21 MED ORDER — BD PEN NEEDLE NANO 2ND GEN 32G X 4 MM MISC: 3 refills | Status: DC

## 2021-07-26 ENCOUNTER — Other Ambulatory Visit: Payer: Self-pay | Admitting: Family Medicine

## 2021-08-04 ENCOUNTER — Telehealth: Payer: Self-pay

## 2021-08-08 DIAGNOSIS — R0602 Shortness of breath: Secondary | ICD-10-CM | POA: Diagnosis not present

## 2021-08-11 NOTE — Telephone Encounter (Signed)
Spoke with pt state that she got the refill and she will call Dr Everardo All office to schedule appointment

## 2021-08-25 ENCOUNTER — Encounter: Payer: Self-pay | Admitting: Adult Health

## 2021-08-25 ENCOUNTER — Other Ambulatory Visit: Payer: Self-pay

## 2021-08-25 ENCOUNTER — Ambulatory Visit (INDEPENDENT_AMBULATORY_CARE_PROVIDER_SITE_OTHER): Payer: BC Managed Care – PPO

## 2021-08-25 ENCOUNTER — Other Ambulatory Visit: Payer: Self-pay | Admitting: *Deleted

## 2021-08-25 ENCOUNTER — Ambulatory Visit: Payer: BC Managed Care – PPO | Admitting: Adult Health

## 2021-08-25 VITALS — BP 120/80 | HR 89 | Temp 99.0°F | Ht 67.0 in | Wt 228.8 lb

## 2021-08-25 DIAGNOSIS — R9389 Abnormal findings on diagnostic imaging of other specified body structures: Secondary | ICD-10-CM | POA: Diagnosis not present

## 2021-08-25 DIAGNOSIS — J209 Acute bronchitis, unspecified: Secondary | ICD-10-CM

## 2021-08-25 DIAGNOSIS — J189 Pneumonia, unspecified organism: Secondary | ICD-10-CM

## 2021-08-25 MED ORDER — AMOXICILLIN-POT CLAVULANATE 875-125 MG PO TABS: 1.0000 | ORAL_TABLET | Freq: Two times a day (BID) | ORAL | 0 refills | Status: AC

## 2021-08-25 NOTE — Patient Instructions (Signed)
Chest xray today .  Augmentin 875mg  Twice daily  for 7 days  Mucinex DM Twice daily  As needed  cough /congestion  Follow up with Dr. in 4-6 weeks and As needed   Please contact office for sooner follow up if symptoms do not improve or worsen or seek emergency care

## 2021-08-25 NOTE — Progress Notes (Signed)
Called and spoke with patient, provided results/recommendations per Rubye Oaks NP, scheduled patient to see Tammy on 09/10/2021 at 12 pm, patient advised to arrive by 11:45 am for check in and cxr prior to visit.  She verbalized understanding.  Nothing further needed.

## 2021-08-25 NOTE — Assessment & Plan Note (Signed)
Check CXR today , if remains abn , consider repeat CT chest  follow up in 4 weeks

## 2021-08-25 NOTE — Progress Notes (Signed)
'@Patient'  ID: Tammy Delgado, female    DOB: 08-Jul-1971, 50 y.o.   MRN: [id number]  Chief Complaint  Patient presents with   Acute Visit    Referring provider: Laurey Morale, MD  HPI: 50 year old female seen for pulmonary consult February 16, 2021 chronic cough after COVID-19, found to have an abnormal CT chest History of COVID-19 infection January 2022 She is a Marine scientist .  Hx of T2 DM on Insulin -Dexcom   TEST/EVENTS :  autoimmune panel is negative.  ESR is normal.  Echocardiogram is normal  08/25/2021 Acute OV : Cough  Presents for an acute office visit. Complains of 3 weeks of cough and congestion . Took Covid test was negative. Cough is some better but lingering . Has some nasal drainage . Taking mucinex and theraflu  Appetite is good no n/v/d. No fever, orthopnea, hemoptysis or edema . No calf pain   Patient was seen for pulmonary consult June 2022 after a prolonged cough after COVID-19 infection in January 2022.  High-resolution CT chest March 05, 2021 showed extensive and diffuse groundglass airspace opacity and septal thickening throughout the lungs-crazy paving, with evidence of subpleural sparing.   Says she has been doing so much better until 3 weeks ago . Off oxygen . Started to exercise,. Back to work . Owns trucking buisness .     Allergies  Allergen Reactions   Chicken Allergy Other (See Comments)    Immunization History  Administered Date(s) Administered   Moderna Sars-Covid-2 Vaccination 11/08/2019, 12/06/2019    Past Medical History:  Diagnosis Date   Anemia    Diabetes mellitus without complication (HCC)    SOBOE (shortness of breath on exertion)     Tobacco History: Social History   Tobacco Use  Smoking Status Never  Smokeless Tobacco Never   Counseling given: Not Answered   Outpatient Medications Prior to Visit  Medication Sig Dispense Refill   albuterol (PROAIR HFA) 108 (90 Base) MCG/ACT inhaler Inhale 2 puffs into the lungs every 4 (four)  hours as needed for wheezing or shortness of breath. 18 g 2   albuterol (PROVENTIL) (2.5 MG/3ML) 0.083% nebulizer solution Take 3 mLs (2.5 mg total) by nebulization every 6 (six) hours as needed for wheezing or shortness of breath. 75 mL 12   Continuous Blood Gluc Sensor (FREESTYLE LIBRE 14 DAY SENSOR) MISC 1 Device by Does not apply route every 14 (fourteen) days. 6 each 3   insulin aspart protamine - aspart (NOVOLOG 70/30 FLEXPEN RELION) (70-30) 100 UNIT/ML FlexPen 45 units with breakfast, and 20 units with the evening meal 60 mL 3   Insulin Pen Needle (BD PEN NEEDLE NANO 2ND GEN) 32G X 4 MM MISC USE AS DIRECTED TWICE DAILY 100 each 3   Iron, Ferrous Sulfate, 325 (65 Fe) MG TABS Take 325 mg by mouth daily. 90 tablet 3   metFORMIN (GLUCOPHAGE-XR) 500 MG 24 hr tablet Take 4 tablets (2,000 mg total) by mouth daily. 360 tablet 3   Semaglutide, 1 MG/DOSE, 4 MG/3ML SOPN Inject 1 mg as directed once a week. 9 mL 3   Vitamin D, Ergocalciferol, (DRISDOL) 1.25 MG (50000 UNIT) CAPS capsule Take 1 capsule (50,000 Units total) by mouth every 7 (seven) days. 4 capsule 0   fluconazole (DIFLUCAN) 150 MG tablet Take 1 tablet (150 mg total) by mouth daily. (Patient not taking: Reported on 08/25/2021) 1 tablet 11   No facility-administered medications prior to visit.     Review of Systems:   Constitutional:  No  weight loss, night sweats,  Fevers, chills, fatigue, or  lassitude.  HEENT:   No headaches,  Difficulty swallowing,  Tooth/dental problems, or  Sore throat,                No sneezing, itching, ear ache, nasal congestion, post nasal drip,   CV:  No chest pain,  Orthopnea, PND, swelling in lower extremities, anasarca, dizziness, palpitations, syncope.   GI  No heartburn, indigestion, abdominal pain, nausea, vomiting, diarrhea, change in bowel habits, loss of appetite, bloody stools.   Resp: No shortness of breath with exertion or at rest.  No excess mucus, no productive cough,  No non-productive  cough,  No coughing up of blood.  No change in color of mucus.  No wheezing.  No chest wall deformity  Skin: no rash or lesions.  GU: no dysuria, change in color of urine, no urgency or frequency.  No flank pain, no hematuria   MS:  No joint pain or swelling.  No decreased range of motion.  No back pain.    Physical Exam  BP 120/80 (BP Location: Left Arm, Patient Position: Sitting, Cuff Size: Large)    Pulse 89    Temp 99 F (37.2 C) (Oral)    Ht '5\' 7"'  (1.702 m)    Wt 228 lb 12.8 oz (103.8 kg)    SpO2 93%    BMI 35.84 kg/m   GEN: A/Ox3; pleasant , NAD, well nourished    HEENT:  [place]/AT,   NOSE-clear, THROAT-clear drainage  no lesions, no postnasal drip or exudate noted.   NECK:  Supple w/ fair ROM; no JVD; normal carotid impulses w/o bruits; no thyromegaly or nodules palpated; no lymphadenopathy.    RESP  Clear  P & A; w/o, wheezes/ rales/ or rhonchi. no accessory muscle use, no dullness to percussion  CARD:  RRR, no m/r/g, no peripheral edema, pulses intact, no cyanosis or clubbing.  GI:   Soft & nt; nml bowel sounds; no organomegaly or masses detected.   Musco: Warm bil, no deformities or joint swelling noted.   Neuro: alert, no focal deficits noted.    Skin: Warm, no lesions or rashes    Lab Results:  CBC    BNP No results found for: BNP  ProBNP No results found for: PROBNP  Imaging: No results found.    PFT Results Latest Ref Rng & Units 04/08/2021  FVC-Pre L 1.28  FVC-Predicted Pre % 51  FVC-Post L 1.94  FVC-Predicted Post % 78  Pre FEV1/FVC % % 99  Post FEV1/FCV % % 89  FEV1-Pre L 1.27  FEV1-Predicted Pre % 63  FEV1-Post L 1.73  DLCO uncorrected ml/min/mmHg 9.62  DLCO UNC% % 52  DLCO corrected ml/min/mmHg 9.62  DLCO COR %Predicted % 52  DLVA Predicted % 83  TLC L 2.93  TLC % Predicted % 65  RV % Predicted % 60    No results found for: NITRICOXIDE      Assessment & Plan:   Acute bronchitis Acute bronchitis - check cxr  Empiric abx    Plan  Patient Instructions  Chest xray today .  Augmentin 893m Twice daily  for 7 days  Mucinex DM Twice daily  As needed  cough /congestion  Follow up with Dr. RChase Callerin 4-6 weeks and As needed   Please contact office for sooner follow up if symptoms do not improve or worsen or seek emergency care        Abnormal CT  of the chest Check CXR today , if remains abn , consider repeat CT chest  follow up in 4 weeks      Rexene Edison, NP 08/25/2021

## 2021-08-25 NOTE — Assessment & Plan Note (Signed)
Acute bronchitis - check cxr  Empiric abx   Plan  Patient Instructions  Chest xray today .  Augmentin 875mg  Twice daily  for 7 days  Mucinex DM Twice daily  As needed  cough /congestion  Follow up with Dr. in 4-6 weeks and As needed   Please contact office for sooner follow up if symptoms do not improve or worsen or seek emergency care

## 2021-08-27 ENCOUNTER — Telehealth: Payer: Self-pay | Admitting: Adult Health

## 2021-08-27 MED ORDER — HYDROCODONE BIT-HOMATROP MBR 5-1.5 MG/5ML PO SOLN: 5.0000 mL | Freq: Every evening | ORAL | 0 refills | Status: DC | PRN

## 2021-08-27 NOTE — Telephone Encounter (Signed)
Hycodan 1 tsp At bedtime As needed  for severe cough  #120 cc NO REFILLS  PMP reviewed  May make you sleepy  Please contact office for sooner follow up if symptoms do not improve or worsen or seek emergency care

## 2021-08-27 NOTE — Telephone Encounter (Signed)
TP please advise.  Thanks   [facility] Pool Phone Number: [phone]   Good morning,  I have been up all night coughing. Can you please prescribe me some HYDROcodone bit-homatropine (HYCODAN) 5-1.5 MG/5ML syrup and send it to [facility] pharmacy.  This only thing help me last time with coughing with pneumonia.   Thanks

## 2021-08-27 NOTE — Telephone Encounter (Signed)
Pt sent mychart message this am- Good morning,  I have been up all night coughing. Can you please prescribe me some HYDROcodone bit-homatropine (HYCODAN) 5-1.5 MG/5ML syrup and send it to [facility] pharmacy.  This only thing help me last time with coughing with pneumonia.   Thanks  Alternate pharmacy-[facility] [address] or [facility] Please advise [phone]

## 2021-08-27 NOTE — Telephone Encounter (Signed)
This patient called back and wants to know if you could send something like cough syrup to the [address]. You had seen her on 08/25/21. She has been taking Delsym, Robitussin, and Mucinex with no relief. Please advise.

## 2021-08-27 NOTE — Telephone Encounter (Signed)
See mychart message from earlier today.

## 2021-09-08 DIAGNOSIS — R0602 Shortness of breath: Secondary | ICD-10-CM | POA: Diagnosis not present

## 2021-09-10 ENCOUNTER — Ambulatory Visit: Payer: BC Managed Care – PPO | Admitting: Adult Health

## 2021-09-15 ENCOUNTER — Ambulatory Visit (INDEPENDENT_AMBULATORY_CARE_PROVIDER_SITE_OTHER): Payer: BC Managed Care – PPO

## 2021-09-15 ENCOUNTER — Other Ambulatory Visit: Payer: Self-pay

## 2021-09-15 ENCOUNTER — Ambulatory Visit: Payer: BC Managed Care – PPO | Admitting: Primary Care

## 2021-09-15 ENCOUNTER — Encounter: Payer: Self-pay | Admitting: Primary Care

## 2021-09-15 VITALS — BP 114/68 | HR 73 | Temp 97.7°F | Ht 67.0 in | Wt 234.2 lb

## 2021-09-15 DIAGNOSIS — J189 Pneumonia, unspecified organism: Secondary | ICD-10-CM | POA: Diagnosis not present

## 2021-09-15 DIAGNOSIS — R918 Other nonspecific abnormal finding of lung field: Secondary | ICD-10-CM

## 2021-09-15 NOTE — Assessment & Plan Note (Addendum)
-   Patient developed new cough end of November 2022. CXR in December 2022 showed new airspace opacities RUL with persistent interstitial opacities else in the lungs. She was treated with course of Augmentin x 7 days. Clinically improving but she still has residual productive cough with yellow mucus and new night sweats. Lungs were clear on exam. No oxygen requirements. Repeat CXR today showed improvement in previous bilateral mixed interstitial and airspaced process. Recommend getting sputum samples and HRCT d.t on going symptoms. May need to consider bronchoscopy if persistent abnormality of the lungs on advanced imaging.

## 2021-09-15 NOTE — Patient Instructions (Addendum)
Orders: HRCT (ordered) Sputum sample ( resp and afb)  Follow-up: We will call with results and discuss next steps      Flexible Bronchoscopy Flexible bronchoscopy is a procedure used to examine the passageways in the lungs. During the procedure, a thin, flexible tool with a camera (bronchoscope) is passed into the mouth or nose, down through the windpipe (trachea), and into the air tubes in the lungs (bronchi). This tool allows the health care provider to look inside the lungs and to take samples for testing, if needed. Tell a health care provider about: Any allergies you have. All medicines you are taking, including vitamins, herbs, eye drops, creams, and over-the-counter medicines. Any problems you or family members have had with anesthetic medicines. Any blood disorders you have. Any surgeries you have had. Any medical conditions you have. Whether you are pregnant or may be pregnant. What are the risks? Generally, this is a safe procedure. However, problems may occur, including: Infection. Bleeding. Damage to other structures or organs. Allergic reactions to medicines. Collapsed lung (pneumothorax). Increased need for oxygen or difficulty breathing after the procedure. What happens before the procedure? Staying hydrated Follow instructions from your health care provider about hydration, which may include: Up to 2 hours before the procedure - you may continue to drink clear liquids, such as water, clear fruit juice, black coffee, and plain tea.  Eating and drinking restrictions Follow instructions from your health care provider about eating and drinking, which may include: 8 hours before the procedure - stop eating heavy meals or foods, such as meat, fried foods, or fatty foods. 6 hours before the procedure - stop eating light meals or foods, such as toast or cereal. 6 hours before the procedure - stop drinking milk or drinks that contain milk. 2 hours before the procedure -  stop drinking clear liquids. Medicines Ask your health care provider about: Changing or stopping your regular medicines. This is especially important if you are taking diabetes medicines or blood thinners. Taking medicines such as aspirin and ibuprofen. These medicines can thin your blood. Do not take these medicines unless your health care provider tells you to take them. Taking over-the-counter medicines, vitamins, herbs, and supplements. General instructions You may be given antibiotic medicine to help lower the risk of infection. Plan to have a responsible adult take you home from the hospital or clinic. If you will be going home right after the procedure, plan to have a responsible adult care for you for the time you are told. This is important. What happens during the procedure? An IV will be inserted into one of your veins. You will be given a medicine (local anesthetic) to numb your mouth, nose, throat, and voice box (larynx). You may also be given one or more of the following: A medicine to help you relax (sedative). A medicine to control coughing. A medicine to dry up any fluids or secretions in your lungs. A bronchoscope will be passed into your nose or mouth, and into your lungs. Your health care provider will examine your lungs. Samples of airway secretions may be collected for testing. If abnormal areas are seen in your airways, samples of tissue may be removed and checked under a microscope (biopsy). If tissue samples are needed from the outer parts of the lung, a type of X-ray (fluoroscopy) may be used to guide the bronchoscope to these areas. If bleeding occurs, you may be given medicine to stop or decrease the bleeding. The procedure may vary among health  care providers and hospitals. What can I expect after the procedure? Your blood pressure, heart rate, breathing rate, and blood oxygen level will be monitored until you leave the hospital or clinic. You may have a chest  X-ray to check for signs of pneumothorax. You willnot be allowed to eat or drink anything for 2 hours after your procedure. If a biopsy was taken, it is up to you to get the results of the test. Ask your health care provider, or the department that is doing the procedure, when your results will be ready. You may have the following symptoms for 24-48 hours: A cough that is worse than it was before the procedure. A low-grade fever. A sore throat or hoarse voice. Some blood in the mucus from your lungs (sputum), if a biopsy was done. Follow these instructions at home: Eating and drinking Do not eat or drink anything, including water, for 2 hours after your procedure, or until your numbing medicine has worn off. Having a numb throat increases your risk of burning yourself or choking. Start eating soft foods and slowly drinking liquids after your numbness is gone and your cough and gag reflexes have returned. You may return to your normal diet the day after the procedure. Driving If you were given a sedative during the procedure, it can affect you for several hours. Do not drive or operate machinery until your health care provider says that it is safe. Ask your health care provider if the medicine prescribed to you requires you to avoid driving or using machinery. Return to your normal activities as told by your health care provider. Ask your health care provider what activities are safe for you. General instructions  Take over-the-counter and prescription medicines only as told by your health care provider. Do not use any products that contain nicotine or tobacco. These products include cigarettes, chewing tobacco, and vaping devices, such as e-cigarettes. If you need help quitting, ask your health care provider. Keep all follow-up visits. This is important. Get help right away if: You have shortness of breath that gets worse. You become light-headed or feel like you might faint. You have chest  pain. You cough up more than a small amount of blood. These symptoms may represent a serious problem that is an emergency. Do not wait to see if the symptoms will go away. Get medical help right away. Call your local emergency services (911 in the [place] Do not drive yourself to the hospital. Summary Flexible bronchoscopy is a procedure that allows your health care provider to look closely inside your lungs and to take testing samples if needed. Risks of flexible bronchoscopy include bleeding, infection, and collapsed lung (pneumothorax). Before the procedure, you will be given a medicine to numb your mouth, nose, throat, and voice box. Then, a bronchoscope will be passed into your nose or mouth, and into your lungs. After the procedure, your blood pressure, heart rate, breathing rate, and blood oxygen level will be monitored until you leave the hospital or clinic. You may have a chest X-ray to check for signs of pneumothorax. You will not be allowed to eat or drink anything for 2 hours after your procedure. This information is not intended to replace advice given to you by your health care provider. Make sure you discuss any questions you have with your health care provider. Document Revised: 05/06/2021 Document Reviewed: 03/13/2020 Elsevier Patient Education  2022 [employer].

## 2021-09-15 NOTE — Progress Notes (Signed)
'@Patient'  ID: Tammy Delgado, female    DOB: 11/23/1970, 51 y.o.   MRN: [id number]  Chief Complaint  Patient presents with   Follow-up    Patient says she is doing good. Patient has no complaints.    Referring provider: Laurey Morale, MD  HPI: 51 year old female, never smoked. PMH significant for chronic bronchitis, diabetes type 2, obesity.   Previous [facility] pulmonary encounter:  51 year old female seen for pulmonary consult February 16, 2021 chronic cough after COVID-19, found to have an abnormal CT chest History of COVID-19 infection January 2022 She is a Marine scientist .  Hx of T2 DM on Insulin -Dexcom   08/25/2021 Acute OV : Cough  Presents for an acute office visit. Complains of 3 weeks of cough and congestion . Took Covid test was negative. Cough is some better but lingering . Has some nasal drainage . Taking mucinex and theraflu  Appetite is good no n/v/d. No fever, orthopnea, hemoptysis or edema . No calf pain   Patient was seen for pulmonary consult June 2022 after a prolonged cough after COVID-19 infection in January 2022.  High-resolution CT chest March 05, 2021 showed extensive and diffuse groundglass airspace opacity and septal thickening throughout the lungs-crazy paving, with evidence of subpleural sparing.   Says she has been doing so much better until 3 weeks ago . Off oxygen . Started to exercise,. Back to work . Owns trucking buisness .    09/15/2021- Interim hx  Patient presents today for 4 week follow-up RUL pneumonia. Finished Augmentin. She has residual productive cough with yellow mucus but overall has improved. She is still taking mucinex and nyquil cold tablets. She does appreciate new night sweats. No known fever. She has not needed to use albuterol rescue inhaler. She owns a trucking business. Denies shortness of breath or wheezing.    TEST/EVENTS :  autoimmune panel is negative.  ESR is normal.  Echocardiogram is normal  Imaging: 03/05/21 HRCT>> Extensive, diffuse,  geographic ground-glass airspace opacity and septal thickening throughout the lungs ("crazy paving"), with evidence of subpleural sparing. This appearance is unusual for COVID airspace disease although has been reported, but this consideration is not favored given approximately 6 month chronicity of reported COVID infection in January 2022. Other cryptogenic organizing pneumonias and pulmonary alveolar proteinosis, as well as drug toxicity are general differential considerations.  08/25/21 CXR >> New airspace opacities in the right upper lobe with persistent but improved interstitial and airspace opacities elsewhere in the lungs. Findings were better demonstrated on prior CT with a broad differential including both pneumonia and other causes of pneumonitis. Recommend imaging follow-up to resolution.  09/15/21 CXR >> Improvement in the previous bilateral mixed interstitial and airspace process when compared to the prior exams. Findings compatible with resolving pneumonia and or pneumonitis. Normal heart size and vascularity. Negative for edema, effusion or pneumothorax   Allergies  Allergen Reactions   Chicken Allergy Other (See Comments)    Immunization History  Administered Date(s) Administered   Moderna Sars-Covid-2 Vaccination 11/08/2019, 12/06/2019    Past Medical History:  Diagnosis Date   Anemia    Diabetes mellitus without complication (HCC)    SOBOE (shortness of breath on exertion)     Tobacco History: Social History   Tobacco Use  Smoking Status Never  Smokeless Tobacco Never   Counseling given: Not Answered   Outpatient Medications Prior to Visit  Medication Sig Dispense Refill   albuterol (PROAIR HFA) 108 (90 Base) MCG/ACT inhaler Inhale 2  puffs into the lungs every 4 (four) hours as needed for wheezing or shortness of breath. 18 g 2   albuterol (PROVENTIL) (2.5 MG/3ML) 0.083% nebulizer solution Take 3 mLs (2.5 mg total) by nebulization every 6 (six)  hours as needed for wheezing or shortness of breath. 75 mL 12   Continuous Blood Gluc Sensor (FREESTYLE LIBRE 14 DAY SENSOR) MISC 1 Device by Does not apply route every 14 (fourteen) days. 6 each 3   fluconazole (DIFLUCAN) 150 MG tablet Take 1 tablet (150 mg total) by mouth daily. 1 tablet 11   insulin aspart protamine - aspart (NOVOLOG 70/30 FLEXPEN RELION) (70-30) 100 UNIT/ML FlexPen 45 units with breakfast, and 20 units with the evening meal 60 mL 3   Insulin Pen Needle (BD PEN NEEDLE NANO 2ND GEN) 32G X 4 MM MISC USE AS DIRECTED TWICE DAILY 100 each 3   Iron, Ferrous Sulfate, 325 (65 Fe) MG TABS Take 325 mg by mouth daily. 90 tablet 3   metFORMIN (GLUCOPHAGE-XR) 500 MG 24 hr tablet Take 4 tablets (2,000 mg total) by mouth daily. 360 tablet 3   Semaglutide, 1 MG/DOSE, 4 MG/3ML SOPN Inject 1 mg as directed once a week. 9 mL 3   Vitamin D, Ergocalciferol, (DRISDOL) 1.25 MG (50000 UNIT) CAPS capsule Take 1 capsule (50,000 Units total) by mouth every 7 (seven) days. 4 capsule 0   HYDROcodone bit-homatropine (HYCODAN) 5-1.5 MG/5ML syrup Take 5 mLs by mouth at bedtime as needed for cough. (Patient not taking: Reported on 09/15/2021) 120 mL 0   No facility-administered medications prior to visit.   Review of Systems  Review of Systems  Constitutional: Negative.   HENT: Negative.    Respiratory: Negative.    Cardiovascular: Negative.     Physical Exam  BP 114/68 (BP Location: Left Arm, Patient Position: Sitting, Cuff Size: Large)    Pulse 73    Temp 97.7 F (36.5 C) (Oral)    Ht '5\' 7"'  (1.702 m)    Wt 234 lb 3.2 oz (106.2 kg)    SpO2 100%    BMI 36.68 kg/m  Physical Exam Constitutional:      General: She is not in acute distress.    Appearance: Normal appearance. She is not ill-appearing.  HENT:     Head: Normocephalic and atraumatic.  Cardiovascular:     Rate and Rhythm: Normal rate and regular rhythm.  Pulmonary:     Effort: Pulmonary effort is normal.     Breath sounds: Normal breath  sounds. No wheezing, rhonchi or rales.  Neurological:     General: No focal deficit present.     Mental Status: She is alert and oriented to person, place, and time. Mental status is at baseline.  Psychiatric:        Mood and Affect: Mood normal.        Behavior: Behavior normal.        Thought Content: Thought content normal.        Judgment: Judgment normal.     Lab Results:  CBC    Component Value Date/Time   WBC 6.5 05/05/2021 1352   RBC 4.13 05/05/2021 1352   HGB 11.6 (L) 05/05/2021 1352   HGB 12.6 05/18/2018 1001   HCT 35.0 (L) 05/05/2021 1352   HCT 37.6 05/18/2018 1001   PLT 288.0 05/05/2021 1352   MCV 84.7 05/05/2021 1352   MCV 91 05/18/2018 1001   MCH 29.6 04/25/2020 0938   MCHC 33.2 05/05/2021 1352   RDW 16.3 (  H) 05/05/2021 1352   RDW 14.1 05/18/2018 1001   LYMPHSABS 3.0 05/05/2021 1352   LYMPHSABS 2.9 05/18/2018 1001   MONOABS 0.6 05/05/2021 1352   EOSABS 0.1 05/05/2021 1352   EOSABS 0.2 05/18/2018 1001   BASOSABS 0.1 05/05/2021 1352   BASOSABS 0.1 05/18/2018 1001    BMET    Component Value Date/Time   NA 140 02/04/2021 1422   NA 139 05/18/2018 1001   K 4.0 02/04/2021 1422   CL 102 02/04/2021 1422   CO2 23 02/04/2021 1422   GLUCOSE 135 (H) 02/04/2021 1422   BUN 12 02/04/2021 1422   BUN 11 05/18/2018 1001   CREATININE 0.86 02/04/2021 1422   CREATININE 0.80 04/25/2020 0938   CALCIUM 9.4 02/04/2021 1422   GFRNONAA 89 05/18/2018 1001   GFRAA 102 05/18/2018 1001    BNP No results found for: BNP  ProBNP No results found for: PROBNP  Imaging: DG Chest 2 View  Result Date: 09/15/2021 CLINICAL DATA:  Pneumonia EXAM: CHEST - 2 VIEW COMPARISON:  08/25/2021, 05/22/2021 FINDINGS: Improvement in the previous bilateral mixed interstitial and airspace process when compared to the prior exams. Findings compatible with resolving pneumonia and or pneumonitis. Normal heart size and vascularity. Negative for edema, effusion or pneumothorax. IMPRESSION: Improving  diffuse pneumonia/pneumonitis pattern. Electronically Signed   By: Jerilynn Mages.  Shick M.D.   On: 09/15/2021 11:36   DG Chest 2 View  Result Date: 08/25/2021 CLINICAL DATA:  acute bronchitis EXAM: CHEST - 2 VIEW COMPARISON:  Chest radiograph 05/22/2021.  Chest CT March 05, 2021. FINDINGS: New airspace opacities in the right upper lobe with persistent but improved interstitial and airspace opacities elsewhere in the lungs. No visible pleural effusions or pneumothorax. Similar cardiomediastinal silhouette. IMPRESSION: New airspace opacities in the right upper lobe with persistent but improved interstitial and airspace opacities elsewhere in the lungs. Findings were better demonstrated on prior CT with a broad differential including both pneumonia and other causes of pneumonitis. Recommend imaging follow-up to resolution. Electronically Signed   By: Margaretha Sheffield M.D.   On: 08/25/2021 11:59     Assessment & Plan:   Pneumonia - Patient developed new cough end of November 2022. CXR in December 2022 showed new airspace opacities RUL with persistent interstitial opacities else in the lungs. She was treated with course of Augmentin x 7 days. Clinically improving but she still has residual productive cough with yellow mucus and new night sweats. Lungs were clear on exam. No oxygen requirements. Repeat CXR today showed improvement in previous bilateral mixed interstitial and airspaced process. Recommend getting sputum samples and HRCT d.t on going symptoms. May need to consider bronchoscopy if persistent abnormality of the lungs on advanced imaging.      Martyn Ehrich, NP 09/15/2021

## 2021-10-09 DIAGNOSIS — R0602 Shortness of breath: Secondary | ICD-10-CM | POA: Diagnosis not present

## 2021-10-13 ENCOUNTER — Telehealth: Payer: Self-pay | Admitting: Internal Medicine

## 2021-10-13 MED ORDER — MOLNUPIRAVIR EUA 200MG CAPSULE: 4.0000 | ORAL_CAPSULE | Freq: Two times a day (BID) | ORAL | 0 refills | Status: AC

## 2021-10-13 NOTE — Telephone Encounter (Signed)
Pt is positive for covid. Syptoms include runny nose, congestion and a little cough. Denies fever. Pt tested positive yesterday morning. She would like monutiravira capsule 20 mg as that is what her husband is taking. Pharmacy is [facility].

## 2021-10-13 NOTE — Telephone Encounter (Signed)
Lagevrio aka Molnupiravir - Oral: 800 mg every 12 hours for 5 days;There are no contraindications listed in the FDA emergency use authorization (EUA) fact sheet for health care providers. But post marlketing - Dermatologic: Erythema of skin, skin rash, urticaria  Hypersensitivity: Anaphylaxis, angioedema, hypersensitivity reaction  Ar reported

## 2021-10-13 NOTE — Telephone Encounter (Signed)
Called and spoke with patient. She stated that she tested positive for COVID last night. She began to develop symptoms on Sunday. Her symptoms were runny nose, chest congestion and a small cough. She has not seen any color to the phlegm she is coughing up.   She mentioned that her husband tested positive last week. They have been in separate rooms but she managed to catch it anyway.   She is requesting to have molnupiravir send to her pharmacy since her husband is currently taking this and has not had any side effects.   Pharmacy is [facility] [address].   MR, can you please advise? Thanks!

## 2021-10-13 NOTE — Telephone Encounter (Signed)
Called and spoke with patient. She verbalized understanding of the instructions and possible side effects. RX has been sent to her pharmacy. Nothing further needed at time of call.

## 2021-11-02 ENCOUNTER — Ambulatory Visit: Payer: BC Managed Care – PPO | Admitting: Internal Medicine

## 2021-11-06 DIAGNOSIS — R0602 Shortness of breath: Secondary | ICD-10-CM | POA: Diagnosis not present

## 2021-11-10 ENCOUNTER — Other Ambulatory Visit: Payer: Self-pay

## 2021-11-10 ENCOUNTER — Ambulatory Visit: Payer: BC Managed Care – PPO | Admitting: Internal Medicine

## 2021-11-10 ENCOUNTER — Encounter: Payer: Self-pay | Admitting: Internal Medicine

## 2021-11-10 VITALS — BP 114/70 | HR 94 | Temp 97.6°F | Ht 66.0 in | Wt 243.6 lb

## 2021-11-10 DIAGNOSIS — J849 Interstitial pulmonary disease, unspecified: Secondary | ICD-10-CM

## 2021-11-10 DIAGNOSIS — Z8616 Personal history of COVID-19: Secondary | ICD-10-CM

## 2021-11-10 DIAGNOSIS — R9389 Abnormal findings on diagnostic imaging of other specified body structures: Secondary | ICD-10-CM | POA: Diagnosis not present

## 2021-11-10 DIAGNOSIS — R918 Other nonspecific abnormal finding of lung field: Secondary | ICD-10-CM | POA: Diagnosis not present

## 2021-11-10 NOTE — Patient Instructions (Addendum)
ICD-10-CM   ?1. Ground glass opacity present on imaging of lung  R91.8   ?  ?2. Abnormal CT of the chest  R93.89   ?  ?3. History of 2019 novel coronavirus disease (COVID-19)  Z86.16   ?  ?4. ILD (interstitial lung disease) (HCC)  J84.9   ?  ? ? ? ? ?Walking test and your symptom  profile suggests natural improement ?Last CXR and CT was June 2022 - both shows diffuse inflammation ? ?Plan ?- can stop  oxygen ? - do HRCT supine and prone ? - do full PFT ? ?[place] ?- video visit with APP or Marcia Lepera next few weeks to discuss trest results ?

## 2021-11-10 NOTE — Progress Notes (Signed)
OV 02/16/2021  Subjective:  Patient ID: Tammy Delgado, female , DOB: 09-16-1970 , age 51 y.o. , MRN: [id number] , ADDRESS: [address] [place] [address] PCP Laurey Morale, MD Patient Care Team: Laurey Morale, MD as PCP - General  This Provider for this visit: Treatment Team:  Attending Provider: Brand Males, MD    02/16/2021 -   Chief Complaint  Patient presents with   Consult    Pt is being referred due to having a chronic cough after covid diagnosis. Pt was diagnosed with covid January 2022. States her cough is worse at night but states that she does cough occ throughout the day.     HPI Tammy Delgado 51 y.o. -former Marine scientist at [employer].  She had a clear chest x-ray in 2009.  She was previously well.  In January 2022 she suffered from oh micron COVID-19.  This was treated as an outpatient.  She had low-grade fever sore throat and fatigue and sinus complaints.  She says she recovered fully from this in about a week.  Then she was fine for a few weeks.  A month later she started developing insidious onset of shortness of breath and cough.  Since then the cough has improved.  Shortness of breath is present on exertion such as climbing stairs.  It is not present for walking around the house or changing clothes.  She feels she is desaturating.  She does not want oxygen.  Today when we walked her she desaturated easily.  She refused to have oxygen.  The desaturation was sustained.  No associated chest pain.  There is no edema.  No wheezing.  While the cough is improved the shortness of breath is persistent.  There is no orthopnea.  She had a chest x-ray that I personally visualized March 2022 shows bilateral bibasal ILD pattern.  This seems to persist in the June 2022 chest x-ray.  She had a recent COVID IgG antibody and strongly positive.    CXR 02/04/21 - personally visualized   IMPRESSION: Persistent BILATERAL pulmonary infiltrates consistent  with multifocal pneumonia. ([place] to mrch 2022 but new since prior csr I 2009)     Electronically Signed   By: Lavonia Dana M.D.   On: 02/05/2021 09:10   Covid IgG  Results for BENISHA, HADAWAY (MRN [id number]) as of 02/16/2021 10:43  Ref. Range 02/04/2021 14:36  SARS COV1 AB(IGG)SPIKE,SEMI QN Latest Ref Range: <1.00 index 108.93 (H)     04/06/2021 Pt. Presents for oxygen Qualification . She had Covid 09/2020. She states she had minimal symptoms, but after she recovered she developed dyspnea. CXR at that time showed Persistent BILATERAL pulmonary infiltrates consistent with .multifocal pneumonia.They were slow to resolve. She was referred to Dr. Chase Caller, who did auto immune testing , echo and an HRCT. Results noted below.  Pt. Was originally offered oxygen in June and at that time declined. She did not feel she needed it. She has been monitoring her oxygen saturations and has seen that her oxygen sats are routinely in the 70's with occasions that drop as low as the 60's. She states she does not have very significant dyspnea with these drops, but recognizes she should be wearing  oxygen. She was walked in the office. Initial sat on RA was in the 70's. She was walked and oxygen was titrated to 2 L [place] for sats of 93-94%. She is using IS, and she does have a rescue inhaler  that she states really helps with her shortness of breath. She has PFT's ordered but not scheduled.   Test Results: CXR 11/2020 BILATERAL lower lung infiltrates consistent with multifocal pneumonia and history of COVID-19 .  CXR 02/05/2021 Persistent BILATERAL pulmonary infiltrates consistent with multifocal pneumonia.  HRCT 03/06/2021 Extensive, diffuse, geographic ground-glass airspace opacity and septal thickening throughout the lungs ("crazy paving"), with evidence of subpleural sparing. This appearance is unusual for COVID airspace disease although has been reported, but this consideration is not favored given  approximately 6 month chronicity of reported COVID infection in January 2022. Other cryptogenic organizing pneumonias and pulmonary alveolar proteinosis, as well as drug toxicity are general differential considerations.   No evidence of fibrotic interstitial lung disease at this time per radiology, but Jarrin Staley feels this is ILD.        The autoimmune panel is negative.  ESR is normal.  Echocardiogram is normal.  But the CT scan of the chest shows ILD.  The ILD could be post-COVID although the radiologist feels the pattern appears somewhat different from post COVID.   OV 05/22/2021  Subjective:  Patient ID: Tammy Delgado, female , DOB: 1971-06-21 , age 51 y.o. , MRN: [id number] , ADDRESS: [id number] [place] [address] PCP Laurey Morale, MD Patient Care Team: Laurey Morale, MD as PCP - General  This Provider for this visit: Treatment Team:  Attending Provider: Brand Males, MD   05/22/2021 -   Chief Complaint  Patient presents with   Follow-up    Patient reports she is about the same with her breathing.      COVID-19 oh micron in January 2022  Since then chronic hypoxemic respiratory failure requiring oxygen with exertion and CT scan of the chest showing bilateral diffuse groundglass opacities [inconsistent with UIP pattern and also inconsistent with classic post-COVID patent]  -PFTs August 2022 consistent with restriction and qualifying for excess hypoxemia oxygen  HPI Tammy Delgado 51 y.o. -presents for follow-up.  Overall since seeing nurse practitioner she is better.  Nurse practitioner had the patient qualified for oxygen.  She says she not using the oxygen.  She think she is doing everything to get better such as taking deep exercises with incentive spirometer.  She says she feels better.  She subjectively does not use oxygen.  This past week she is not used oxygen even at night.  Sometime back she went to her tennis tournament of her daughters  and she felt tired and then she came back and use oxygen.  She does not check her pulse ox.  Everything is based on subjective needs.  Subjectively she is feeling better.  Current symptom score and walking desaturation test documented below.Is clear she has fair amount of symptoms - walking test shows improvement PFT   09/15/2021- Interim hx  Patient presents today for 4 week follow-up RUL pneumonia. Finished Augmentin. She has residual productive cough with yellow mucus but overall has improved. She is still taking mucinex and nyquil cold tablets. She does appreciate new night sweats. No known fever. She has not needed to use albuterol rescue inhaler. She owns a trucking business. Denies shortness of breath or wheezing.    TEST/EVENTS :  autoimmune panel is negative.  ESR is normal.  Echocardiogram is normal  Imaging: 03/05/21 HRCT>> Extensive, diffuse, geographic ground-glass airspace opacity and septal thickening throughout the lungs ("crazy paving"), with evidence of subpleural sparing. This appearance is unusual for COVID airspace disease although has been reported, but  this consideration is not favored given approximately 6 month chronicity of reported COVID infection in January 2022. Other cryptogenic organizing pneumonias and pulmonary alveolar proteinosis, as well as drug toxicity are general differential considerations.  08/25/21 CXR >> New airspace opacities in the right upper lobe with persistent but improved interstitial and airspace opacities elsewhere in the lungs. Findings were better demonstrated on prior CT with a broad differential including both pneumonia and other causes of pneumonitis. Recommend imaging follow-up to resolution.  09/15/21 CXR >> Improvement in the previous bilateral mixed interstitial and airspace process when compared to the prior exams. Findings compatible with resolving pneumonia and or pneumonitis. Normal heart size and vascularity. Negative for  edema, effusion or pneumothorax    OV 11/10/2021  Subjective:  Patient ID: Tammy Delgado, female , DOB: May 15, 1971 , age 29 y.o. , MRN: [id number] , ADDRESS: [address] [place] [address] PCP Laurey Morale, MD Patient Care Team: Laurey Morale, MD as PCP - General  This Provider for this visit: Treatment Team:  Attending Provider: Brand Males, MD    11/10/2021 -   Chief Complaint  Patient presents with   Follow-up    No concerns.  Wants to have oxygen discontinued.    COVID-19 oh micron in January 2022  Since then chronic hypoxemic respiratory failure requiring oxygen with exertion and CT scan of the chest showing bilateral diffuse groundglass opacities [inconsistent with UIP pattern and also inconsistent with classic post-COVID patent]  -PFTs August 2022 consistent with r ILD  -Negative serology summer 2022  HPI Tammy Delgado 51 y.o. -last seen by myself in September 2022.  After that in January 2023 she followed up with nurse practitioner for acute infectious symptoms.  An outpatient diagnosis of pneumonia was made.  Antibiotics were given.  At this point in time she says she is feeling completely better.  She still has oxygen with her at home but she is not using it.  Apparently the DME company refused to take the oxygen back and they are charging her regularly.  This is despite me making an assessment in September 2022 of improved walking desaturation test and adequacy on pulse ox.  Compared to when I saw her in September 2022 her ILD symptom questionnaire shows significant improvement.  Her walking desaturation test is adequate.  She is supposed to have CT scan high-resolution but somehow this has not been done.  She is frustrated by this.  She wants this done soon so she can go to [place] in April 2023.  I did support request to remove her oxygen from the house.    CT Chest data  No results found.     SYMPTOM SCALE - ILD 05/22/2021 11/10/2021    Current weight 234# 243#  O2 use ra ra  Shortness of Breath 0 -> 5 scale with 5 being worst (score 6 If unable to do)   At rest 0 0  Simple tasks - showers, clothes change, eating, shaving 1 0  Household (dishes, doing bed, laundry) 3 0  Shopping 2 0  Walking level at own pace 3 0  Walking up Stairs 3 2  Total (30-36) Dyspnea Score 12 2  How bad is your cough? 0 0  How bad is your fatigue 1 0  How bad is nausea 0 0  How bad is vomiting?  00 0  How bad is diarrhea? 0 0  How bad is anxiety? 0 0  How bad is depression 00 0  Any chronic pain - if so where and how bad 0 0         Simple office walk 185 feet x  3 laps goal with forehead probe 02/16/2021  05/22/2021  11/10/2021   O2 used ra ra   Number laps completed 3 2 of 3 on RAM   Comments about pace avg avg   Resting Pulse Ox/HR 96% and 78/min 96% and HR 73   Final Pulse Ox/HR 85% at 1st lap and final pulse ox 82% and 108/min to 115/mikn 90% ad HR 80 93% at end of 3 laps  Desaturated </= 88% yes no   Desaturated <= 3% points yes no   Got Tachycardic >/= 90/min Yes, 11 points Yes, 6 points   Symptoms at end of test Mild dyspnea dyspnea   Miscellaneous comments Refused o2  improved     PFT  PFT Results Latest Ref Rng & Units 04/08/2021  FVC-Pre L 1.28  FVC-Predicted Pre % 51  FVC-Post L 1.94  FVC-Predicted Post % 78  Pre FEV1/FVC % % 99  Post FEV1/FCV % % 89  FEV1-Pre L 1.27  FEV1-Predicted Pre % 63  FEV1-Post L 1.73  DLCO uncorrected ml/min/mmHg 9.62  DLCO UNC% % 52  DLCO corrected ml/min/mmHg 9.62  DLCO COR %Predicted % 52  DLVA Predicted % 83  TLC L 2.93  TLC % Predicted % 65  RV % Predicted % 60     Latest Reference Range & Units 62/70/35 00:93  Cyclic Citrullin Peptide Ab UNITS <16  ds DNA Ab IU/mL <1  RA Latex Turbid. <14 IU/mL <14     has a past medical history of Anemia, Diabetes mellitus without complication ([place]), and SOBOE (shortness of breath on exertion).   reports that she has never  smoked. She has never used smokeless tobacco.  Past Surgical History:  Procedure Laterality Date   [place] and 2007    Allergies  Allergen Reactions   Chicken Allergy Other (See Comments)    Immunization History  Administered Date(s) Administered   Moderna Sars-Covid-2 Vaccination 11/08/2019, 12/06/2019    Family History  Problem Relation Age of Onset   Diabetes Mother    Hypertension Mother    Cancer Mother    Hypertension Maternal Grandmother    Diabetes Maternal Grandmother    Hypertension Father    Cancer Father      Current Outpatient Medications:    albuterol (PROAIR HFA) 108 (90 Base) MCG/ACT inhaler, Inhale 2 puffs into the lungs every 4 (four) hours as needed for wheezing or shortness of breath., Disp: 18 g, Rfl: 2   albuterol (PROVENTIL) (2.5 MG/3ML) 0.083% nebulizer solution, Take 3 mLs (2.5 mg total) by nebulization every 6 (six) hours as needed for wheezing or shortness of breath., Disp: 75 mL, Rfl: 12   Continuous Blood Gluc Sensor (FREESTYLE LIBRE 14 DAY SENSOR) MISC, 1 Device by Does not apply route every 14 (fourteen) days., Disp: 6 each, Rfl: 3   fluconazole (DIFLUCAN) 150 MG tablet, Take 1 tablet (150 mg total) by mouth daily., Disp: 1 tablet, Rfl: 11   insulin aspart protamine - aspart (NOVOLOG 70/30 FLEXPEN RELION) (70-30) 100 UNIT/ML FlexPen, 45 units with breakfast, and 20 units with the evening meal, Disp: 60 mL, Rfl: 3   Insulin Pen Needle (BD PEN NEEDLE NANO 2ND GEN) 32G X 4 MM MISC, USE AS DIRECTED TWICE DAILY, Disp: 100 each, Rfl: 3   Iron, Ferrous Sulfate, 325 (65 Fe) MG TABS, Take  325 mg by mouth daily., Disp: 90 tablet, Rfl: 3   metFORMIN (GLUCOPHAGE-XR) 500 MG 24 hr tablet, Take 4 tablets (2,000 mg total) by mouth daily., Disp: 360 tablet, Rfl: 3   Semaglutide, 1 MG/DOSE, 4 MG/3ML SOPN, Inject 1 mg as directed once a week., Disp: 9 mL, Rfl: 3   Vitamin D, Ergocalciferol, (DRISDOL) 1.25 MG (50000 UNIT) CAPS capsule, Take 1 capsule  (50,000 Units total) by mouth every 7 (seven) days., Disp: 4 capsule, Rfl: 0      Objective:   Vitals:   11/10/21 1641  BP: 114/70  Pulse: 94  Temp: 97.6 F (36.4 C)  TempSrc: Oral  SpO2: 99%  Weight: 243 lb 9.6 oz (110.5 kg)  Height: '5\' 6"'  (1.676 m)    Estimated body mass index is 39.32 kg/m as calculated from the following:   Height as of this encounter: '5\' 6"'  (1.676 m).   Weight as of this encounter: 243 lb 9.6 oz (110.5 kg).  '@WEIGHTCHANGE' @  [employer]   11/10/21 1641  Weight: 243 lb 9.6 oz (110.5 kg)     Physical Exam    General: No distress. obese Neuro: Alert and Oriented x 3. GCS 15. Speech normal Psych: Pleasant Resp:  Barrel Chest - no.  Wheeze - no, Crackles - no, No overt respiratory distress CVS: Normal heart sounds. Murmurs - no Ext: Stigmata of Connective Tissue Disease - no HEENT: Normal upper airway. PEERL +. No post nasal drip        Assessment:       ICD-10-CM   1. Ground glass opacity present on imaging of lung  R91.8 Ambulatory Referral for DME    CT Chest High Resolution    Pulmonary Function Test    2. Abnormal CT of the chest  R93.89     3. History of 2019 novel coronavirus disease (COVID-19)  Z86.16     4. ILD (interstitial lung disease) ([place])  J84.9 Ambulatory Referral for DME    CT Chest High Resolution    Pulmonary Function Test     She is probably gradually improving latest research shows that 1 year out a lot of patients with COVID-19 ILD changes show significant improvement but still have residual abnormalities.  Will get high-resolution CT chest and pulmonary function test and reassess.  She is okay with this plan.  We will have to do this in the next few weeks to facilitate her travel to [place]:     Patient Instructions     ICD-10-CM   1. Ground glass opacity present on imaging of lung  R91.8     2. Abnormal CT of the chest  R93.89     3. History of 2019 novel coronavirus disease (COVID-19)  Z86.16      4. ILD (interstitial lung disease) ([place])  J84.9         Walking test and your symptom  profile suggests natural improement Last CXR and CT was June 2022 - both shows diffuse inflammation  Plan - can stop  oxygen  - do HRCT supine and prone  - do full PFT  [place] - video visit with APP or Darcee Dekker next few weeks to discuss trest results    SIGNATURE    Dr. Brand Males, M.D., F.C.C.P,  Pulmonary and Critical Care Medicine Staff Physician, [place] Director - Interstitial Lung Disease  Program  P[facility] [place] [facility] [place], [place], [address]  Pager: [phone]  [phone], If no answer or between  15:00h - 7:00h: call [phone] Telephone: [phone]  6:10 PM 11/10/2021

## 2021-11-20 ENCOUNTER — Other Ambulatory Visit: Payer: Self-pay

## 2021-11-20 ENCOUNTER — Ambulatory Visit (INDEPENDENT_AMBULATORY_CARE_PROVIDER_SITE_OTHER)
Admission: RE | Admit: 2021-11-20 | Discharge: 2021-11-20 | Disposition: A | Discharge status: Home / Self Care | Payer: BC Managed Care – PPO | Source: Ambulatory Visit | Attending: Internal Medicine | Admitting: Internal Medicine

## 2021-11-20 DIAGNOSIS — R918 Other nonspecific abnormal finding of lung field: Secondary | ICD-10-CM | POA: Diagnosis not present

## 2021-11-20 DIAGNOSIS — J849 Interstitial pulmonary disease, unspecified: Secondary | ICD-10-CM

## 2021-11-22 NOTE — Progress Notes (Signed)
Ct chest with improvement. Should complete PFT and visit with Tammy Parrett ?Xxxx ? ?CT Chest High Resolution ? ?Result Date: 11/22/2021 ?CLINICAL DATA:  51 year old female with history of ground-glass opacity on prior imaging. Follow-up study. EXAM: CT CHEST WITHOUT CONTRAST TECHNIQUE: Multidetector CT imaging of the chest was performed following the standard protocol without intravenous contrast. High resolution imaging of the lungs, as well as inspiratory and expiratory imaging, was performed. RADIATION DOSE REDUCTION: This exam was performed according to the departmental dose-optimization program which includes automated exposure control, adjustment of the mA and/or kV according to patient size and/or use of iterative reconstruction technique. COMPARISON:  Chest CT 03/05/2021. FINDINGS: Cardiovascular: Heart size is normal. There is no significant pericardial fluid, thickening or pericardial calcification. No atherosclerotic calcifications are noted in the thoracic aorta or the coronary arteries. Mediastinum/Nodes: No pathologically enlarged mediastinal or hilar lymph nodes. Please note that accurate exclusion of hilar adenopathy is limited on noncontrast CT scans. Esophagus is unremarkable in appearance. No axillary lymphadenopathy. Lungs/Pleura: When compared to the prior study from 03/05/2021 the extent of parenchymal lung changes has significantly decreased with regression of many of the areas of previously noted ground-glass attenuation and septal thickening scattered randomly throughout the lungs bilaterally. There continue to be substantial regions of involvement, although overall there has been a substantial normalization of the appearance of large swaths of the lung parenchyma. No traction bronchiectasis or honeycombing identified. These findings have no discernible craniocaudal gradient and are slightly asymmetrically distributed involving the left lung to a greater extent than the right. No confluent  consolidative airspace disease. No pleural effusions. No definite suspicious appearing pulmonary nodules or masses are noted. Upper Abdomen: Incompletely imaged low-attenuation lesion in the interpolar region of the right kidney measuring at least 3.2 cm in diameter, not characterized on today's non-contrast CT examination, but statistically likely to represent a large cyst. Musculoskeletal: There are no aggressive appearing lytic or blastic lesions noted in the visualized portions of the skeleton. IMPRESSION: 1. Improving appearance of the lungs, with a spectrum of findings considered most compatible with an alternative diagnosis (not usual interstitial pneumonia) per current ATS guidelines. 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 Electronically Signed   By: Trudie Reed M.D.   On: 11/22/2021 12:29  ? ?

## 2021-12-07 DIAGNOSIS — R0602 Shortness of breath: Secondary | ICD-10-CM | POA: Diagnosis not present

## 2021-12-21 ENCOUNTER — Ambulatory Visit: Payer: BC Managed Care – PPO | Admitting: Adult Health

## 2022-02-03 ENCOUNTER — Other Ambulatory Visit: Payer: Self-pay | Admitting: Family Medicine

## 2022-02-03 NOTE — Telephone Encounter (Signed)
Pt would like to have refill today due to her insurance is changing

## 2022-04-14 ENCOUNTER — Encounter (INDEPENDENT_AMBULATORY_CARE_PROVIDER_SITE_OTHER): Payer: Self-pay

## 2022-05-28 ENCOUNTER — Encounter: Payer: Self-pay | Admitting: Internal Medicine

## 2022-05-28 ENCOUNTER — Ambulatory Visit: Payer: BC Managed Care – PPO | Admitting: Internal Medicine

## 2022-05-28 VITALS — BP 124/86 | HR 82 | Ht 66.0 in | Wt 253.0 lb

## 2022-05-28 DIAGNOSIS — E1165 Type 2 diabetes mellitus with hyperglycemia: Secondary | ICD-10-CM | POA: Diagnosis not present

## 2022-05-28 DIAGNOSIS — E785 Hyperlipidemia, unspecified: Secondary | ICD-10-CM

## 2022-05-28 DIAGNOSIS — Z794 Long term (current) use of insulin: Secondary | ICD-10-CM | POA: Diagnosis not present

## 2022-05-28 DIAGNOSIS — E119 Type 2 diabetes mellitus without complications: Secondary | ICD-10-CM

## 2022-05-28 LAB — POCT GLYCOSYLATED HEMOGLOBIN (HGB A1C): Hemoglobin A1C: 9.9 % — AB (ref 4.0–5.6)

## 2022-05-28 LAB — LIPID PANEL
Cholesterol: 172 mg/dL (ref 0–200)
HDL: 47.1 mg/dL (ref >39.00)
LDL Cholesterol: 111 mg/dL — ABNORMAL HIGH (ref 0–99)
NonHDL: 125.17
Total CHOL/HDL Ratio: 4
Triglycerides: 73 mg/dL (ref 0.0–149.0)
VLDL: 14.6 mg/dL (ref 0.0–40.0)

## 2022-05-28 LAB — COMPREHENSIVE METABOLIC PANEL
ALT: 15 U/L (ref 0–35)
AST: 15 U/L (ref 0–37)
Albumin: 4 g/dL (ref 3.5–5.2)
Alkaline Phosphatase: 98 U/L (ref 39–117)
BUN: 11 mg/dL (ref 6–23)
CO2: 26 mEq/L (ref 19–32)
Calcium: 9 mg/dL (ref 8.4–10.5)
Chloride: 101 mEq/L (ref 96–112)
Creatinine, Ser: 0.93 mg/dL (ref 0.40–1.20)
GFR: 71.51 mL/min (ref >60.00)
Glucose, Bld: 306 mg/dL — ABNORMAL HIGH (ref 70–99)
Potassium: 4.5 mEq/L (ref 3.5–5.1)
Sodium: 134 mEq/L — ABNORMAL LOW (ref 135–145)
Total Bilirubin: 0.6 mg/dL (ref 0.2–1.2)
Total Protein: 7.9 g/dL (ref 6.0–8.3)

## 2022-05-28 LAB — MICROALBUMIN / CREATININE URINE RATIO
Creatinine,U: 102.1 mg/dL
Microalb Creat Ratio: 0.7 mg/g (ref 0.0–30.0)
Microalb, Ur: <0.7 mg/dL (ref 0.0–1.9)

## 2022-05-28 LAB — POCT GLUCOSE (DEVICE FOR HOME USE): POC Glucose: 336 mg/dl — AB (ref 70–99)

## 2022-05-28 MED ORDER — DEXCOM G7 SENSOR MISC: 1.0000 | 3 refills | Status: DC

## 2022-05-28 MED ORDER — BD PEN NEEDLE NANO 2ND GEN 32G X 4 MM MISC: 1.0000 | Freq: Two times a day (BID) | 3 refills | Status: DC

## 2022-05-28 MED ORDER — NOVOLOG 70/30 FLEXPEN RELION (70-30) 100 UNIT/ML ~~LOC~~ SUPN: 45.0000 [IU] | PEN_INJECTOR | Freq: Two times a day (BID) | SUBCUTANEOUS | 6 refills | Status: DC

## 2022-05-28 MED ORDER — TIRZEPATIDE 5 MG/0.5ML ~~LOC~~ SOAJ
5.0000 mg | SUBCUTANEOUS | 3 refills | Status: DC
Start: 2022-05-28 — End: 2022-10-13

## 2022-05-28 MED ORDER — METFORMIN HCL ER 500 MG PO TB24: 2000.0000 mg | ORAL_TABLET | Freq: Every day | ORAL | 3 refills | Status: DC

## 2022-05-28 NOTE — Patient Instructions (Addendum)
Continue Metformin 500 mg XR, 2 tablets twice daily   Change NovoLog mix 45 units with breakfast and 45 units with supper  Start Mounjaro 5 mg once weekly    HOW TO TREAT LOW BLOOD SUGARS (Blood sugar LESS THAN 70 MG/DL) Please follow the RULE OF 15 for the treatment of hypoglycemia treatment (when your (blood sugars are less than 70 mg/dL)   STEP 1: Take 15 grams of carbohydrates when your blood sugar is low, which includes:  3-4 GLUCOSE TABS  OR 3-4 OZ OF JUICE OR REGULAR SODA OR ONE TUBE OF GLUCOSE GEL    STEP 2: RECHECK blood sugar in 15 MINUTES STEP 3: If your blood sugar is still low at the 15 minute recheck --> then, go back to STEP 1 and treat AGAIN with another 15 grams of carbohydrates.

## 2022-05-28 NOTE — Progress Notes (Unsigned)
Name: Tammy Delgado  Age/ Sex: 51 y.o., female   MRN/ DOB: [id number], 1970/10/22     PCP: Laurey Morale, MD   Reason for Endocrinology Evaluation: Type 2 Diabetes Mellitus  Initial Endocrine Consultative Visit: 01/16/2021    PATIENT IDENTIFIER: Tammy Delgado is a 51 y.o. female with a past medical history of T2DM, OSA. The patient has followed with Endocrinology clinic since 01/16/2021 for consultative assistance with management of her diabetes.  DIABETIC HISTORY:  [name] was diagnosed with DM 2014, has been on insulin since her diagnosis. Her hemoglobin A1c has ranged from 8.7% in 2022, peaking at 11.9% in 2014.    The patient had follow-up With Dr. Loanne Drilling from May 2022 until August 2022  Of note she was also seen by Dr. Cruzita Lederer in 2014    SUBJECTIVE:   During the last visit (05/04/2021 ): saw Dr. Loanne Drilling  Today (05/28/2022): Ms. Much is here for follow-up on diabetes management.  She checks her blood sugars 3 times daily. The patient has not had hypoglycemic episodes since the last clinic visit She drank cranberry juice this morning  She skips metformin when she has diarrhea , on average once a week   She ran out of Ozempic ~ 6 months ago  She works as a Counsellor   Denies nausea, vomiting or diarrhea   Eats 2 meals a day but does not snack    [place]:  Metformin 500 mg XR, 2 tabs BID  Ozempic 1 mg weekly NovoLog mix 40 units QAM and 45 QPM     Statin: No ACE-I/ARB: No    METER DOWNLOAD SUMMARY: did not bring     DIABETIC COMPLICATIONS: Microvascular complications:  Denies: CKD Last Eye Exam: Completed   Macrovascular complications:   Denies: CAD, CVA, PVD   HISTORY:  Past Medical History:  Past Medical History:  Diagnosis Date   Anemia    Diabetes mellitus without complication ([place])    SOBOE (shortness of breath on exertion)    Past Surgical History:  Past Surgical History:  Procedure Laterality Date    [place] and 2007   Social History:  reports that she has never smoked. She has never used smokeless tobacco. She reports that she does not drink alcohol and does not use drugs. Family History:  Family History  Problem Relation Age of Onset   Diabetes Mother    Hypertension Mother    Cancer Mother    Hypertension Maternal Grandmother    Diabetes Maternal Grandmother    Hypertension Father    Cancer Father      HOME MEDICATIONS: Allergies as of 05/28/2022       Reactions   Chicken Allergy Other (See Comments)        Medication List        Accurate as of May 28, 2022 11:16 AM. If you have any questions, ask your nurse or doctor.          STOP taking these medications    fluconazole 150 MG tablet Commonly known as: Diflucan Stopped by: Dorita Sciara, MD   Semaglutide (1 MG/DOSE) 4 MG/3ML Sopn Stopped by: Dorita Sciara, MD       TAKE these medications    albuterol 108 (90 Base) MCG/ACT inhaler Commonly known as: ProAir HFA Inhale 2 puffs into the lungs every 4 (four) hours as needed for wheezing or shortness of breath.   albuterol (2.5 MG/3ML) 0.083% nebulizer solution Commonly known  as: PROVENTIL Take 3 mLs (2.5 mg total) by nebulization every 6 (six) hours as needed for wheezing or shortness of breath.   BD Pen Needle Nano 2nd Gen 32G X 4 MM Misc Generic drug: Insulin Pen Needle 1 Device by Other route in the morning and at bedtime. USE AS DIRECTED TWICE DAILY What changed:  how much to take how to take this when to take this Changed by: Dorita Sciara, MD   Dexcom G7 Sensor Misc 1 Device by Does not apply route as directed. What changed: when to take this Changed by: Dorita Sciara, MD   Iron (Ferrous Sulfate) 325 (65 Fe) MG Tabs Take 325 mg by mouth daily.   metFORMIN 500 MG 24 hr tablet Commonly known as: GLUCOPHAGE-XR Take 4 tablets (2,000 mg total) by mouth daily.   NovoLOG 70/30 FlexPen  (70-30) 100 UNIT/ML FlexPen Generic drug: insulin aspart protamine - aspart Inject 45 Units into the skin 2 (two) times daily with a meal. INJECT 40 UNITS INTO THE SKIN IN THE MORNING AND INJECT 45 UNITS IN THE EVENING. LAST REFILL PATIENT NEEDS APPOINTMENT What changed:  how much to take how to take this when to take this Changed by: Dorita Sciara, MD   tirzepatide 5 MG/0.5ML Pen Commonly known as: MOUNJARO Inject 5 mg into the skin once a week. Started by: Dorita Sciara, MD   Vitamin D (Ergocalciferol) 1.25 MG (50000 UNIT) Caps capsule Commonly known as: DRISDOL Take 1 capsule (50,000 Units total) by mouth every 7 (seven) days.         OBJECTIVE:   Vital Signs: BP 124/86 (BP Location: Left Arm, Patient Position: Sitting, Cuff Size: Large)   Pulse 82   Ht 5\' 6"  (1.676 m)   Wt 253 lb (114.8 kg)   SpO2 96%   BMI 40.84 kg/m   Wt Readings from Last 3 Encounters:  05/28/22 253 lb (114.8 kg)  11/10/21 243 lb 9.6 oz (110.5 kg)  09/15/21 234 lb 3.2 oz (106.2 kg)     Exam: General: Pt appears well and is in NAD  Neck: General: Supple without adenopathy. Thyroid: Thyroid size normal.  No goiter or nodules appreciated.   Lungs: Clear with good BS bilat   Heart: RRR   Abdomen:  soft, nontender  Extremities: No pretibial edema.   Neuro: MS is good with appropriate affect, pt is alert and Ox3    DM foot exam: 05/28/2022  The skin of the feet is intact without sores or ulcerations. The pedal pulses are 2+ on right and 2+ on left. The sensation is intact to a screening 5.07, 10 gram monofilament bilaterally        DATA REVIEWED:  Lab Results  Component Value Date   HGBA1C 9.9 (A) 05/28/2022   HGBA1C 8.7 (A) 05/04/2021   HGBA1C 10.0 (H) 12/03/2020   Lab Results  Component Value Date   MICROALBUR <0.7 09/27/2017   LDLCALC 119 (H) 12/03/2020   CREATININE 0.86 02/04/2021   Lab Results  Component Value Date   MICRALBCREAT 2.5 05/18/2018     Lab  Results  Component Value Date   CHOL 180 12/03/2020   HDL 47.10 12/03/2020   LDLCALC 119 (H) 12/03/2020   TRIG 72.0 12/03/2020   CHOLHDL 4 12/03/2020        In office Bg 336 mg/dL  ASSESSMENT / PLAN / RECOMMENDATIONS:   1) Type 2 Diabetes Mellitus, Poorly controlled, Without complications - Most recent A1c of 9.9 %. Goal  A1c <7.0%.     -Poorly controlled diabetes due to dietary indiscretion, her in office BG today is 336 mg/DL, patient had cranberry juice for symptoms of UTI, I did explain to the patient that the reason for UTI is hyperglycemia and drinking juice will worsen the hyperglycemia hence UTI.  Patient may use cranberry tablets instead. -She has been out of Ozempic for approximately 6 months, she is interested in trying [facility], cautioned against GI side effects -I am going to increase her a.m. insulin as below -No change to metformin dose -We will prescribe Dexcom   MEDICATIONS: Continue metformin 500 mg XR, 2 tabs twice daily Increase NovoLog Mix 45 units with breakfast and 45 units with supper Start Mounjaro 5 mg weekly  EDUCATION / INSTRUCTIONS: BG monitoring instructions: Patient is instructed to check her blood sugars 2 times a day before meals Call [place] [facility] if: BG persistently < 70  I reviewed the Rule of 15 for the treatment of hypoglycemia in detail with the patient. Literature supplied.    2) Diabetic complications:  Eye: Does not have known diabetic retinopathy.  Neuro/ Feet: Does not have known diabetic peripheral neuropathy .  Renal: Patient does not have known baseline CKD. She   is not on an ACEI/ARB at present.     3) Dyslipidemia :  -Historically her LDL has been above goal, we discussed the cardiovascular benefits as well as ADA recommendation and starting statin therapy -We will gust again next visit   F/U in 6 months     Signed electronically by: Mack Guise, MD  [facility]  [facility] [address] [place], [place] [address] Phone: [phone] FAX: [phone]   CC: Laurey Morale, [place] [address] Phone: [phone]  Fax: [phone]  Return to Endocrinology clinic as below: No future appointments.

## 2022-06-07 ENCOUNTER — Telehealth: Payer: Self-pay

## 2022-06-07 ENCOUNTER — Other Ambulatory Visit (HOSPITAL_COMMUNITY): Payer: Self-pay

## 2022-06-07 NOTE — Telephone Encounter (Signed)
Patient states that paperwork was sent over for a PA on Mounjaro.

## 2022-07-15 DIAGNOSIS — Z01419 Encounter for gynecological examination (general) (routine) without abnormal findings: Secondary | ICD-10-CM | POA: Diagnosis not present

## 2022-07-15 DIAGNOSIS — Z1231 Encounter for screening mammogram for malignant neoplasm of breast: Secondary | ICD-10-CM | POA: Diagnosis not present

## 2022-10-13 ENCOUNTER — Encounter: Payer: Self-pay | Admitting: Internal Medicine

## 2022-10-13 ENCOUNTER — Ambulatory Visit: Payer: BC Managed Care – PPO | Admitting: Internal Medicine

## 2022-10-13 VITALS — BP 124/72 | HR 81 | Ht 66.0 in | Wt 232.0 lb

## 2022-10-13 DIAGNOSIS — E119 Type 2 diabetes mellitus without complications: Secondary | ICD-10-CM | POA: Diagnosis not present

## 2022-10-13 DIAGNOSIS — E559 Vitamin D deficiency, unspecified: Secondary | ICD-10-CM | POA: Diagnosis not present

## 2022-10-13 LAB — POCT GLYCOSYLATED HEMOGLOBIN (HGB A1C): Hemoglobin A1C: 6.4 % — AB (ref 4.0–5.6)

## 2022-10-13 MED ORDER — VITAMIN D (ERGOCALCIFEROL) 1.25 MG (50000 UNIT) PO CAPS: 50000.0000 [IU] | ORAL_CAPSULE | ORAL | 2 refills | Status: DC

## 2022-10-13 MED ORDER — METFORMIN HCL ER 500 MG PO TB24: 2000.0000 mg | ORAL_TABLET | Freq: Every day | ORAL | 3 refills | Status: DC

## 2022-10-13 MED ORDER — TIRZEPATIDE 7.5 MG/0.5ML ~~LOC~~ SOAJ
7.5000 mg | SUBCUTANEOUS | 3 refills | Status: DC
Start: 2022-10-13 — End: 2023-04-15

## 2022-10-13 NOTE — Patient Instructions (Addendum)
  Continue Metformin 500 mg XR, 2 tablets twice daily   Increase  Mounjaro 7.5 mg once weekly    HOW TO TREAT LOW BLOOD SUGARS (Blood sugar LESS THAN 70 MG/DL) Please follow the RULE OF 15 for the treatment of hypoglycemia treatment (when your (blood sugars are less than 70 mg/dL)   STEP 1: Take 15 grams of carbohydrates when your blood sugar is low, which includes:  3-4 GLUCOSE TABS  OR 3-4 OZ OF JUICE OR REGULAR SODA OR ONE TUBE OF GLUCOSE GEL    STEP 2: RECHECK blood sugar in 15 MINUTES STEP 3: If your blood sugar is still low at the 15 minute recheck --> then, go back to STEP 1 and treat AGAIN with another 15 grams of carbohydrates.

## 2022-10-13 NOTE — Progress Notes (Signed)
Name: Tammy Delgado  Age/ Sex: 52 y.o., female   MRN/ DOB: [id number], 1971/05/07     PCP: Laurey Morale, MD   Reason for Endocrinology Evaluation: Type 2 Diabetes Mellitus  Initial Endocrine Consultative Visit: 01/16/2021    Delgado IDENTIFIER: [name] is a 52 y.o. female with a past medical history of T2DM, OSA. Tammy Delgado has followed with Endocrinology clinic since 01/16/2021 for consultative assistance with management of her diabetes.  DIABETIC HISTORY:  Tammy Delgado was diagnosed with DM 2014, has been on insulin since her diagnosis. Her hemoglobin A1c has ranged from 8.7% in 2022, peaking at 11.9% in 2014.    Tammy Delgado had follow-up With Dr. Loanne Drilling from May 2022 until August 2022  Of note she was also seen by Dr. Cruzita Lederer in 2014   Mounjaro prescribed 05/2022 with an A1c of 9.9%  NovoLog Mix was discontinued 10/2022 with an A1c of 6.4%    SUBJECTIVE:   During Tammy last visit (05/28/2022): A1c 9.9%  Today (10/13/2022): Tammy Delgado is here for follow-up on diabetes management.  She checks her blood sugars occasionally . Tammy Delgado has not had hypoglycemic episodes since Tammy last clinic visit   Denies nausea, vomiting or diarrhea  Due to optimal BG's , she has not been using novolog mix as much , last dose was new years.    HOME DIABETES REGIMEN:  Metformin 500 mg XR, 2 tabs BID  Mounjaro 5 mg weekly ( Monday)  NovoLog mix 45 units QAM and 45 QPM     Statin: No ACE-I/ARB: No    METER DOWNLOAD SUMMARY: unable to download 90 day average 160 mg/dL  83-392 mg/dL    DIABETIC COMPLICATIONS: Microvascular complications:  Denies: CKD Last Eye Exam: Completed   Macrovascular complications:   Denies: CAD, CVA, PVD   HISTORY:  Past Medical History:  Past Medical History:  Diagnosis Date   Anemia    Diabetes mellitus without complication ([place])    SOBOE (shortness of breath on exertion)    Past Surgical History:  Past Surgical History:   Procedure Laterality Date   [place] and 2007   Social History:  reports that she has never smoked. She has never used smokeless tobacco. She reports that she does not drink alcohol and does not use drugs. Family History:  Family History  Problem Relation Age of Onset   Diabetes Mother    Hypertension Mother    Cancer Mother    Hypertension Maternal Grandmother    Diabetes Maternal Grandmother    Hypertension Father    Cancer Father      HOME MEDICATIONS: Allergies as of 10/13/2022       Reactions   Chicken Allergy Other (See Comments)        Medication List        Accurate as of October 13, 2022  4:00 PM. If you have any questions, ask your nurse or doctor.          STOP taking these medications    NovoLOG 70/30 FlexPen (70-30) 100 UNIT/ML FlexPen Generic drug: insulin aspart protamine - aspart Stopped by: Dorita Sciara, MD   tirzepatide 5 MG/0.5ML Pen Commonly known as: MOUNJARO Replaced by: tirzepatide 7.5 MG/0.5ML Pen Stopped by: Dorita Sciara, MD       TAKE these medications    albuterol 108 (90 Base) MCG/ACT inhaler Commonly known as: ProAir HFA Inhale 2 puffs into Tammy lungs every 4 (four) hours as needed  for wheezing or shortness of breath.   albuterol (2.5 MG/3ML) 0.083% nebulizer solution Commonly known as: PROVENTIL Take 3 mLs (2.5 mg total) by nebulization every 6 (six) hours as needed for wheezing or shortness of breath.   BD Pen Needle Nano 2nd Gen 32G X 4 MM Misc Generic drug: Insulin Pen Needle 1 Device by Other route in Tammy morning and at bedtime. USE AS DIRECTED TWICE DAILY   Dexcom G7 Sensor Misc 1 Device by Does not apply route as directed.   Iron (Ferrous Sulfate) 325 (65 Fe) MG Tabs Take 325 mg by mouth daily.   metFORMIN 500 MG 24 hr tablet Commonly known as: GLUCOPHAGE-XR Take 4 tablets (2,000 mg total) by mouth daily.   tirzepatide 7.5 MG/0.5ML Pen Commonly known as: MOUNJARO Inject 7.5 mg  into Tammy skin once a week. Replaces: tirzepatide 5 MG/0.5ML Pen Started by: Dorita Sciara, MD   Vitamin D (Ergocalciferol) 1.25 MG (50000 UNIT) Caps capsule Commonly known as: DRISDOL Take 1 capsule (50,000 Units total) by mouth every 7 (seven) days.         OBJECTIVE:   Vital Signs: BP 124/72 (BP Location: Left Arm, Delgado Position: Sitting, Cuff Size: Large)   Pulse 81   Ht 5\' 6"  (1.676 m)   Wt 232 lb (105.2 kg)   SpO2 97%   BMI 37.45 kg/m   Wt Readings from Last 3 Encounters:  10/13/22 232 lb (105.2 kg)  05/28/22 253 lb (114.8 kg)  11/10/21 243 lb 9.6 oz (110.5 kg)     Exam: General: Pt appears well and is in NAD  Neck: General: Supple without adenopathy. Thyroid: Thyroid size normal.  No goiter or nodules appreciated.   Lungs: Clear with good BS bilat   Heart: RRR   Abdomen:  soft, nontender  Extremities: No pretibial edema.   Neuro: MS is good with appropriate affect, pt is alert and Ox3    DM foot exam: 05/28/2022  Tammy skin of Tammy feet is intact without sores or ulcerations. Tammy pedal pulses are 2+ on right and 2+ on left. Tammy sensation is intact to a screening 5.07, 10 gram monofilament bilaterally        DATA REVIEWED:  Lab Results  Component Value Date   HGBA1C 6.4 (A) 10/13/2022   HGBA1C 9.9 (A) 05/28/2022   HGBA1C 8.7 (A) 05/04/2021    Latest Reference Range & Units 05/28/22 11:23  Sodium 135 - 145 mEq/L 134 (L)  Potassium 3.5 - 5.1 mEq/L 4.5  Chloride 96 - 112 mEq/L 101  CO2 19 - 32 mEq/L 26  Glucose 70 - 99 mg/dL 306 (H)  BUN 6 - 23 mg/dL 11  Creatinine 0.40 - 1.20 mg/dL 0.93  Calcium 8.4 - 10.5 mg/dL 9.0  Alkaline Phosphatase 39 - 117 U/L 98  Albumin 3.5 - 5.2 g/dL 4.0  AST 0 - 37 U/L 15  ALT 0 - 35 U/L 15  Total Protein 6.0 - 8.3 g/dL 7.9  Total Bilirubin 0.2 - 1.2 mg/dL 0.6  GFR >60.00 mL/min 71.51    Latest Reference Range & Units 05/28/22 11:23  Total CHOL/HDL Ratio  4  Cholesterol 0 - 200 mg/dL 172  HDL  Cholesterol >39.00 mg/dL 47.10  LDL (calc) 0 - 99 mg/dL 111 (H)  MICROALB/CREAT RATIO 0.0 - 30.0 mg/g 0.7  NonHDL  125.17  Triglycerides 0.0 - 149.0 mg/dL 73.0  VLDL 0.0 - 40.0 mg/dL 14.6    Latest Reference Range & Units 05/28/22 11:23  Creatinine,U mg/dL  102.1  Microalb, Ur 0.0 - 1.9 mg/dL <0.7  MICROALB/CREAT RATIO 0.0 - 30.0 mg/g 0.7     ASSESSMENT / PLAN / RECOMMENDATIONS:   1) Type 2 Diabetes Mellitus, Optimally  controlled, Without complications - Most recent A1c of 6.4 %. Goal A1c <7.0%.     -I have praised Tammy Delgado on  improved glycemic control, A1c down from 9.9% -She has made drastic lifestyle changes including avoiding sugar sweetened beverages -She has not used NovoLog Mix in almost a month, she is 5 units around [place] for hyperglycemia, I did discuss Tammy risk of hypoglycemia with insulin mix and if she would like something to use based on a correction scale I will be happy to put her on plain NovoLog -Discussed increasing Mounjaro and stopping insulin    MEDICATIONS: Continue metformin 500 mg XR, 2 tabs twice daily Increase Mounjaro 7.5 mg weekly Stop NovoLog Mix  EDUCATION / INSTRUCTIONS: BG monitoring instructions: Delgado is instructed to check her blood sugars 2 times a day before meals Call [place] [facility] if: BG persistently < 70  I reviewed Tammy Rule of 15 for Tammy treatment of hypoglycemia in detail with Tammy Delgado. Literature supplied.    2) Diabetic complications:  Eye: Does not have known diabetic retinopathy.  Neuro/ Feet: Does not have known diabetic peripheral neuropathy .  Renal: Delgado does not have known baseline CKD. She   is not on an ACEI/ARB at present.     3) Dyslipidemia :  -Historically her LDL has been above goal, we discussed Tammy cardiovascular benefits as well as ADA recommendation and starting statin therapy - LDL continues to be above goal  -We will revisit  again in Tammy future   4) Vitamin D Deficiency  :  -A refill was sent for ergocalciferol 50,000 IU weekly   F/U in 6 months     Signed electronically by: Mack Guise, MD  [facility]  [facility] [address] [place],  [address] Phone: [phone] FAX: [phone]   CC: Laurey Morale, [place] [address] Phone: [phone]  Fax: [phone]  Return to Endocrinology clinic as below: Future Appointments  Date Time Provider [place]  04/15/2023 11:10 AM Shamon Cothran, Melanie Crazier, MD [id number] None

## 2022-10-20 IMAGING — CT CT CHEST HIGH RESOLUTION
2 of 8 series · 14 of 36 positions shown, 17 images · non-contrast
Comparison: Chest CT 03/05/2021.

CLINICAL DATA: 50-year-old female with history of ground-glass
opacity on prior imaging. Follow-up study.



[Series 5: high resolution · axial · 0.66mm/px · z∈[-252,-26]mm · 11 of 271 slices shown, 14 images]
[im 23/271  mediastinal]
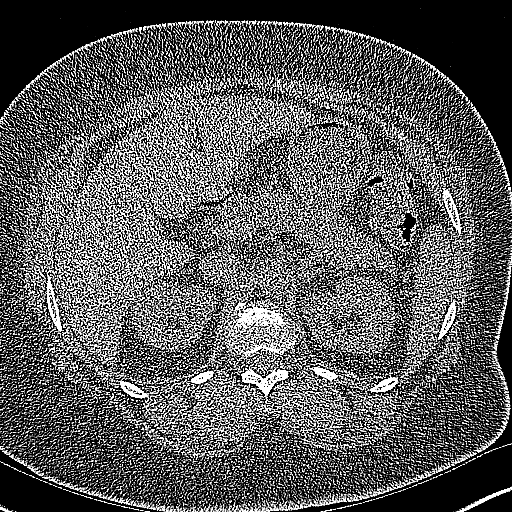
[im 23/271  lung]
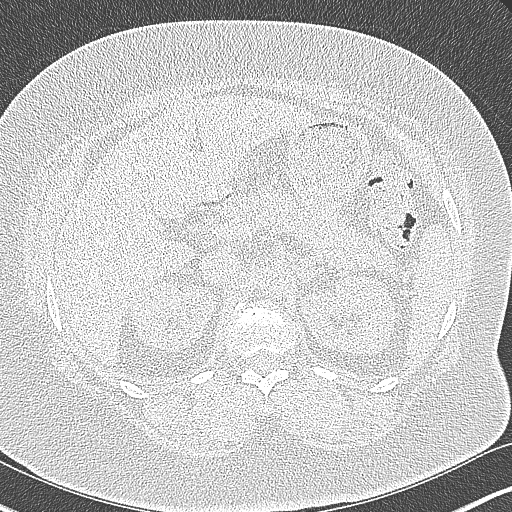
[im 46/271  lung]
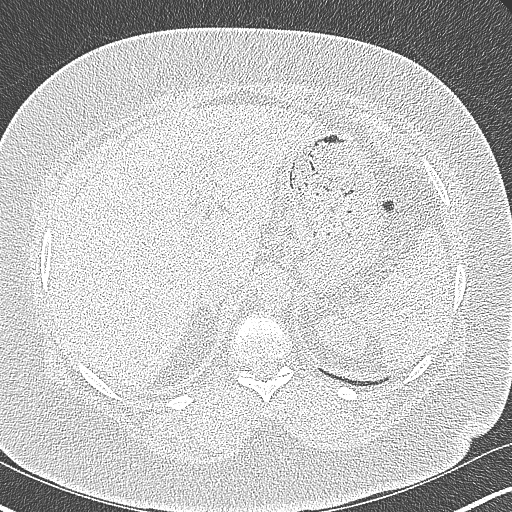
[im 68/271  lung]
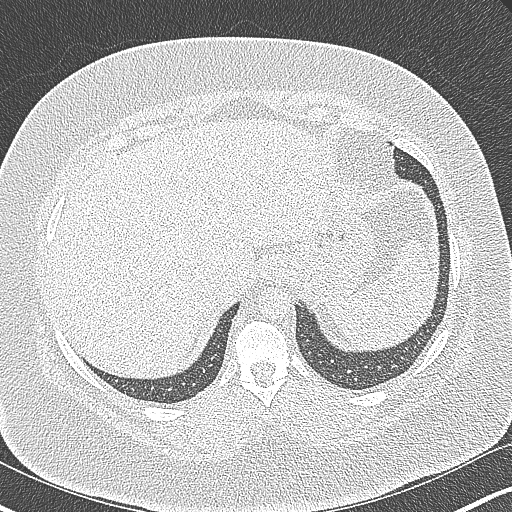
[im 91/271  lung]
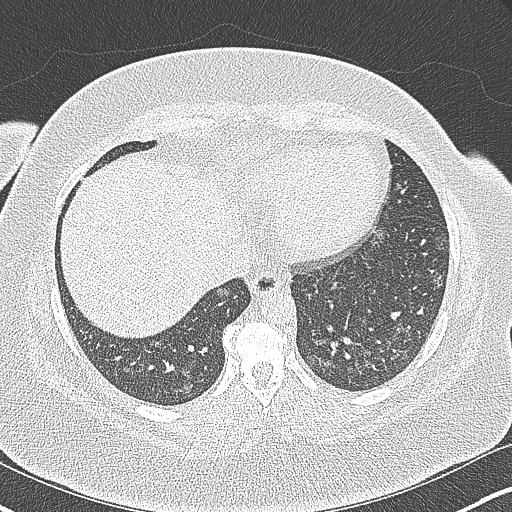
[im 113/271  mediastinal]
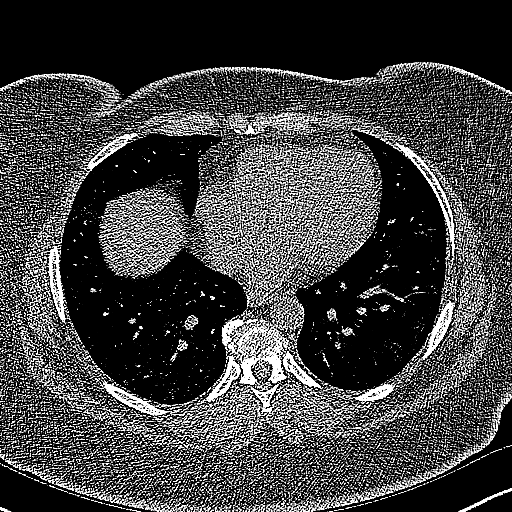
[im 113/271  lung]
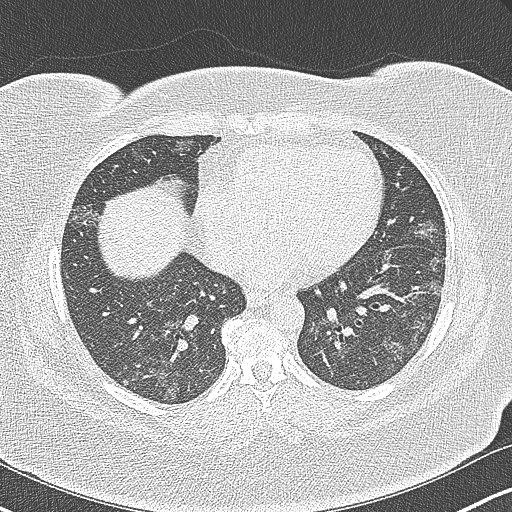
[im 136/271  lung]
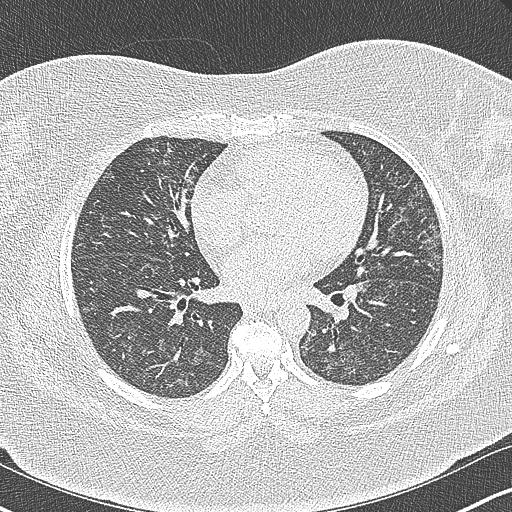
[im 158/271  lung]
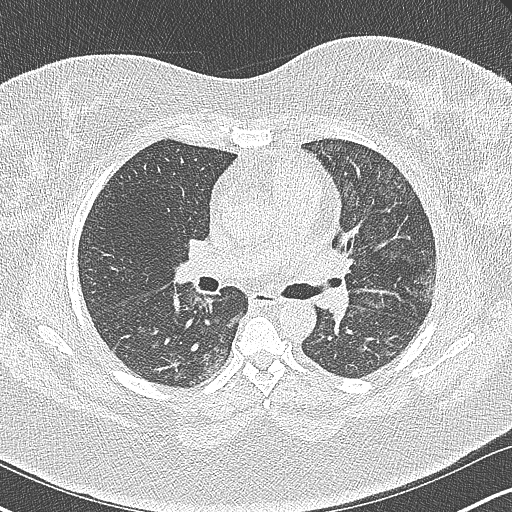
[im 181/271  lung]
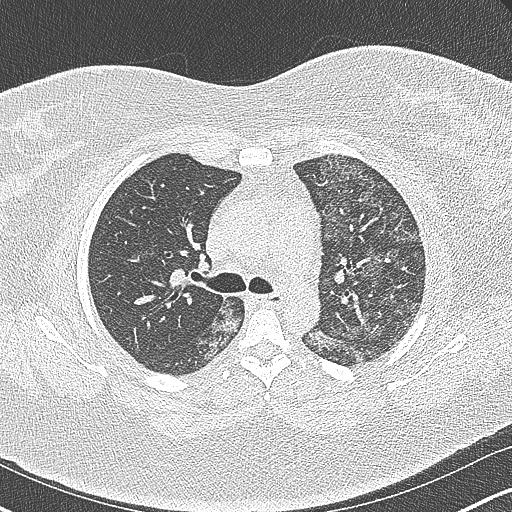
[im 203/271  mediastinal]
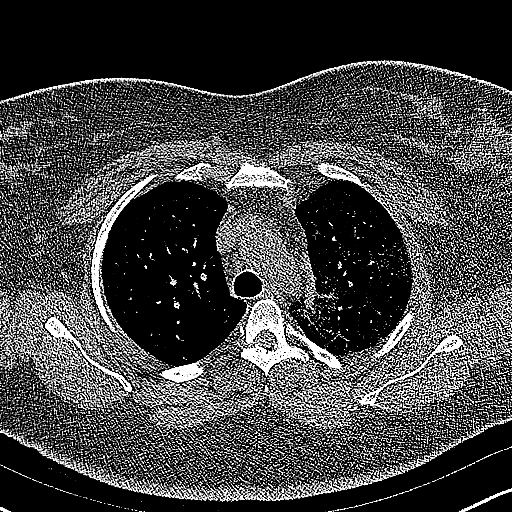
[im 203/271  lung]
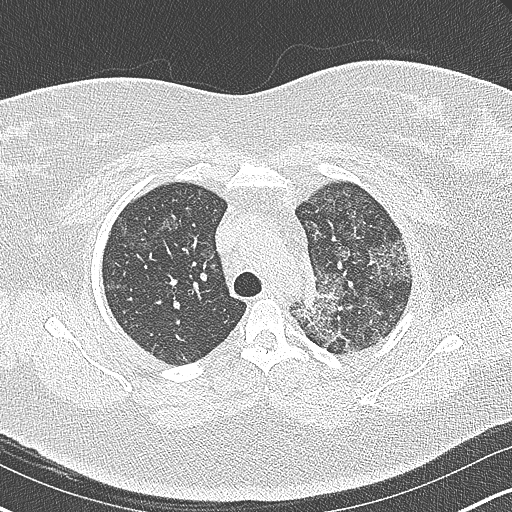
[im 226/271  lung]
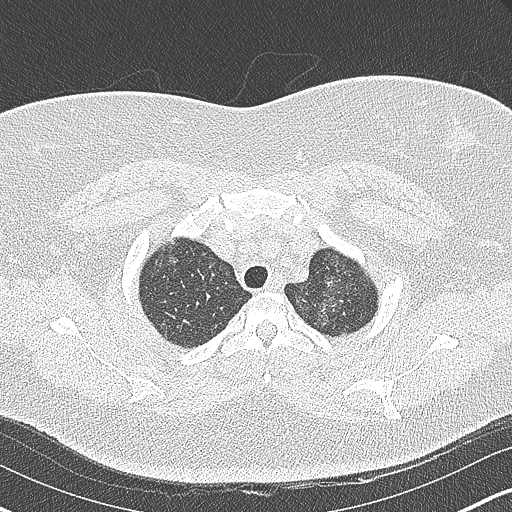
[im 248/271  lung]
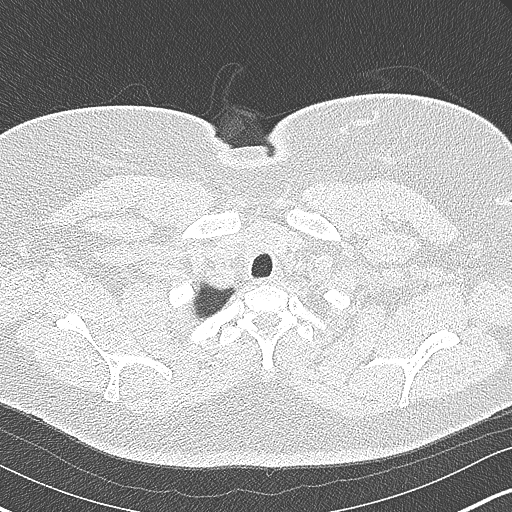

[Series 7: coronal · coronal · 0.57mm/px · 3 of 120 slices shown]
[im 24/120  lung]
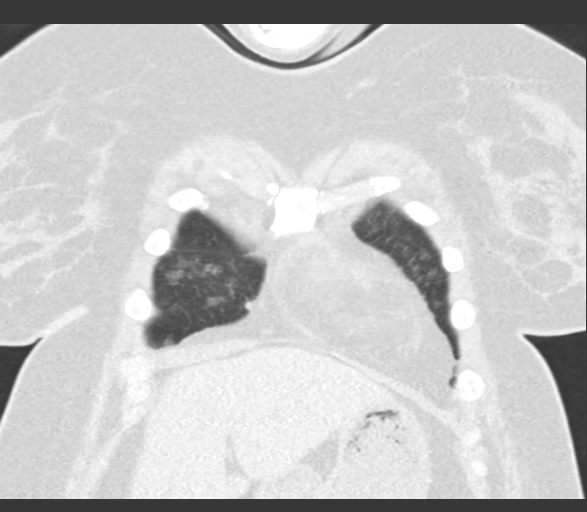
[im 48/120  lung]
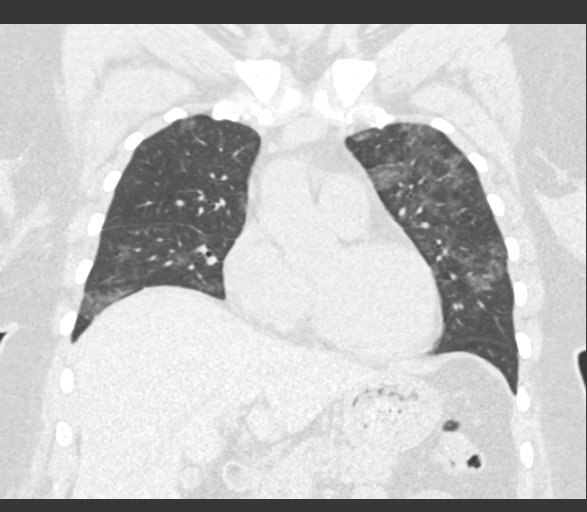
[im 72/120  lung]
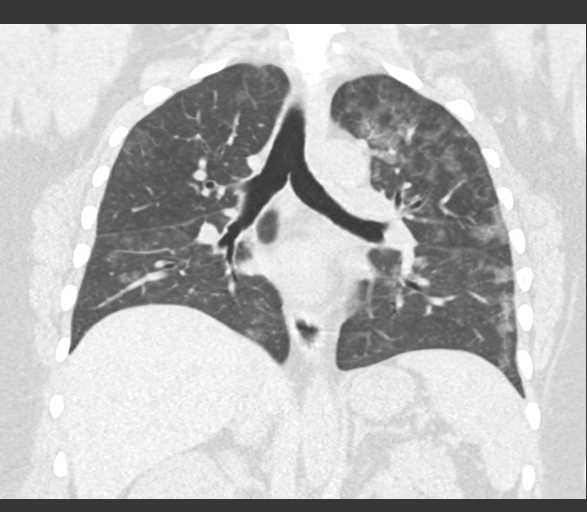

[14 of 36 positions shown; findings below may reference images not displayed]

FINDINGS: Cardiovascular: Heart size is normal. There is no significant
pericardial fluid, thickening or pericardial calcification. No
atherosclerotic calcifications are noted in the thoracic aorta or
the coronary arteries.

Mediastinum/Nodes: No pathologically enlarged mediastinal or hilar
lymph nodes. Please note that accurate exclusion of hilar adenopathy
is limited on noncontrast CT scans. Esophagus is unremarkable in
appearance. No axillary lymphadenopathy.

Lungs/Pleura: When compared to the prior study from 03/05/2021 the
extent of parenchymal lung changes has significantly decreased with
regression of many of the areas of previously noted ground-glass
attenuation and septal thickening scattered randomly throughout the
lungs bilaterally. There continue to be substantial regions of
involvement, although overall there has been a substantial
normalization of the appearance of large swaths of the lung
parenchyma. No traction bronchiectasis or honeycombing identified.
These findings have no discernible craniocaudal gradient and are
slightly asymmetrically distributed involving the left lung to a
greater extent than the right. No confluent consolidative airspace
disease. No pleural effusions. No definite suspicious appearing
pulmonary nodules or masses are noted.

Upper Abdomen: Incompletely imaged low-attenuation lesion in the
interpolar region of the right kidney measuring at least 3.2 cm in
diameter, not characterized on today's non-contrast CT examination,
but statistically likely to represent a large cyst.

Musculoskeletal: There are no aggressive appearing lytic or blastic
lesions noted in the visualized portions of the skeleton.
IMPRESSION: 1. Improving appearance of the lungs, with a spectrum of findings
considered most compatible with an alternative diagnosis (not usual
interstitial pneumonia) per current ATS guidelines. Given the
patient's history of prior COVID infection followed by chronic
cough, findings likely reflect improving post
infectious/inflammatory fibrosis.

## 2023-04-15 ENCOUNTER — Ambulatory Visit: Payer: BC Managed Care – PPO | Admitting: Internal Medicine

## 2023-04-15 ENCOUNTER — Encounter: Payer: Self-pay | Admitting: Internal Medicine

## 2023-04-15 VITALS — BP 114/72 | HR 71 | Ht 66.0 in | Wt 200.8 lb

## 2023-04-15 DIAGNOSIS — E785 Hyperlipidemia, unspecified: Secondary | ICD-10-CM | POA: Diagnosis not present

## 2023-04-15 DIAGNOSIS — Z794 Long term (current) use of insulin: Secondary | ICD-10-CM | POA: Diagnosis not present

## 2023-04-15 DIAGNOSIS — E1165 Type 2 diabetes mellitus with hyperglycemia: Secondary | ICD-10-CM | POA: Diagnosis not present

## 2023-04-15 LAB — COMPREHENSIVE METABOLIC PANEL
ALT: 9 U/L (ref 0–35)
AST: 13 U/L (ref 0–37)
Albumin: 4.1 g/dL (ref 3.5–5.2)
Alkaline Phosphatase: 74 U/L (ref 39–117)
BUN: 9 mg/dL (ref 6–23)
CO2: 29 mEq/L (ref 19–32)
Calcium: 8.9 mg/dL (ref 8.4–10.5)
Chloride: 101 mEq/L (ref 96–112)
Creatinine, Ser: 0.82 mg/dL (ref 0.40–1.20)
GFR: 82.66 mL/min (ref >60.00)
Glucose, Bld: 88 mg/dL (ref 70–99)
Potassium: 3.3 mEq/L — ABNORMAL LOW (ref 3.5–5.1)
Sodium: 138 mEq/L (ref 135–145)
Total Bilirubin: 1.1 mg/dL (ref 0.2–1.2)
Total Protein: 7.2 g/dL (ref 6.0–8.3)

## 2023-04-15 LAB — LIPID PANEL
Cholesterol: 192 mg/dL (ref 0–200)
HDL: 38.2 mg/dL — ABNORMAL LOW (ref >39.00)
LDL Cholesterol: 137 mg/dL — ABNORMAL HIGH (ref 0–99)
NonHDL: 153.64
Total CHOL/HDL Ratio: 5
Triglycerides: 83 mg/dL (ref 0.0–149.0)
VLDL: 16.6 mg/dL (ref 0.0–40.0)

## 2023-04-15 LAB — MICROALBUMIN / CREATININE URINE RATIO
Creatinine,U: 460.9 mg/dL
Microalb Creat Ratio: 0.2 mg/g (ref 0.0–30.0)
Microalb, Ur: 1 mg/dL (ref 0.0–1.9)

## 2023-04-15 LAB — POCT GLYCOSYLATED HEMOGLOBIN (HGB A1C): Hemoglobin A1C: 5.7 % — AB (ref 4.0–5.6)

## 2023-04-15 MED ORDER — METFORMIN HCL ER 500 MG PO TB24: 500.0000 mg | ORAL_TABLET | Freq: Every day | ORAL | 3 refills | Status: DC

## 2023-04-15 MED ORDER — METFORMIN HCL ER 500 MG PO TB24: 2000.0000 mg | ORAL_TABLET | Freq: Every day | ORAL | 3 refills | Status: DC

## 2023-04-15 MED ORDER — TIRZEPATIDE 10 MG/0.5ML ~~LOC~~ SOAJ
10.0000 mg | SUBCUTANEOUS | 3 refills | Status: DC
Start: 2023-04-15 — End: 2023-10-17

## 2023-04-15 NOTE — Progress Notes (Signed)
Name: Tammy Delgado  Age/ Sex: 52 y.o., female   MRN/ DOB: [id number], 02/11/1971     PCP: Nelwyn Salisbury, MD   Reason for Endocrinology Evaluation: Type 2 Diabetes Mellitus  Initial Endocrine Consultative Visit: 01/16/2021    PATIENT IDENTIFIER: Tammy Delgado is a 52 y.o. female with a past medical history of T2DM, OSA. The patient has followed with Endocrinology clinic since 01/16/2021 for consultative assistance with management of her diabetes.  DIABETIC HISTORY:  Ms. Waechter was diagnosed with DM 2014, has been on insulin since her diagnosis. Her hemoglobin A1c has ranged from 8.7% in 2022, peaking at 11.9% in 2014.    The patient had follow-up With Dr. Everardo All from May 2022 until August 2022  Of note she was also seen by Dr. Elvera Lennox in 2014   Mounjaro prescribed 05/2022 with an A1c of 9.9%  NovoLog Mix was discontinued 10/2022 with an A1c of 6.4%    SUBJECTIVE:   During the last visit (10/13/2022): A1c 6.4%  Today (04/15/2023): Ms. Speir is here for follow-up on diabetes management.  She checks her blood sugars occasionally . The patient has not had hypoglycemic episodes since the last clinic visit  Patient continues with weight loss Denies nausea or vomiting  Has occasional constipation but no diarrhea    HOME DIABETES REGIMEN:  Metformin 500 mg XR, 2 tabs BID  Mounjaro 7.5 mg weekly ( Monday)     Statin: No ACE-I/ARB: No    METER DOWNLOAD SUMMARY: unable to download 82- 392 mg/dl    DIABETIC COMPLICATIONS: Microvascular complications:  Denies: CKD Last Eye Exam: scheduled end 04/2023  Macrovascular complications:   Denies: CAD, CVA, PVD   HISTORY:  Past Medical History:  Past Medical History:  Diagnosis Date   Anemia    Diabetes mellitus without complication (HCC)    SOBOE (shortness of breath on exertion)    Past Surgical History:  Past Surgical History:  Procedure Laterality Date   CESAREAN SECTION  1995 and 2007   Social  History:  reports that she has never smoked. She has never used smokeless tobacco. She reports that she does not drink alcohol and does not use drugs. Family History:  Family History  Problem Relation Age of Onset   Diabetes Mother    Hypertension Mother    Cancer Mother    Hypertension Maternal Grandmother    Diabetes Maternal Grandmother    Hypertension Father    Cancer Father      HOME MEDICATIONS: Allergies as of 04/15/2023       Reactions   Chicken Allergy Other (See Comments)        Medication List        Accurate as of April 15, 2023 10:56 AM. If you have any questions, ask your nurse or doctor.          albuterol 108 (90 Base) MCG/ACT inhaler Commonly known as: ProAir HFA Inhale 2 puffs into the lungs every 4 (four) hours as needed for wheezing or shortness of breath.   albuterol (2.5 MG/3ML) 0.083% nebulizer solution Commonly known as: PROVENTIL Take 3 mLs (2.5 mg total) by nebulization every 6 (six) hours as needed for wheezing or shortness of breath.   Dexcom G7 Sensor Misc 1 Device by Does not apply route as directed.   Iron (Ferrous Sulfate) 325 (65 Fe) MG Tabs Take 325 mg by mouth daily.   metFORMIN 500 MG 24 hr tablet Commonly known as: GLUCOPHAGE-XR Take 4 tablets (2,000 mg  total) by mouth daily.   tirzepatide 7.5 MG/0.5ML Pen Commonly known as: MOUNJARO Inject 7.5 mg into the skin once a week.   Vitamin D (Ergocalciferol) 1.25 MG (50000 UNIT) Caps capsule Commonly known as: DRISDOL Take 1 capsule (50,000 Units total) by mouth every 7 (seven) days.         OBJECTIVE:   Vital Signs: Ht 5\' 6"  (1.676 m)   Wt 200 lb 12.8 oz (91.1 kg)   BMI 32.41 kg/m   Wt Readings from Last 3 Encounters:  04/15/23 200 lb 12.8 oz (91.1 kg)  10/13/22 232 lb (105.2 kg)  05/28/22 253 lb (114.8 kg)     Exam: General: Pt appears well and is in NAD  Neck: General: Supple without adenopathy. Thyroid: Thyroid size normal.  No goiter or nodules  appreciated.   Lungs: Clear with good BS bilat   Heart: RRR   Abdomen:  soft, nontender  Extremities: No pretibial edema.   Neuro: MS is good with appropriate affect, pt is alert and Ox3    DM foot exam: 05/28/2022  The skin of the feet is intact without sores or ulcerations. The pedal pulses are 2+ on right and 2+ on left. The sensation is intact to a screening 5.07, 10 gram monofilament bilaterally        DATA REVIEWED:  Lab Results  Component Value Date   HGBA1C 6.4 (A) 10/13/2022   HGBA1C 9.9 (A) 05/28/2022   HGBA1C 8.7 (A) 05/04/2021    Latest Reference Range & Units 05/28/22 11:23  Sodium 135 - 145 mEq/L 134 (L)  Potassium 3.5 - 5.1 mEq/L 4.5  Chloride 96 - 112 mEq/L 101  CO2 19 - 32 mEq/L 26  Glucose 70 - 99 mg/dL 811 (H)  BUN 6 - 23 mg/dL 11  Creatinine 9.14 - 7.82 mg/dL 9.56  Calcium 8.4 - 21.3 mg/dL 9.0  Alkaline Phosphatase 39 - 117 U/L 98  Albumin 3.5 - 5.2 g/dL 4.0  AST 0 - 37 U/L 15  ALT 0 - 35 U/L 15  Total Protein 6.0 - 8.3 g/dL 7.9  Total Bilirubin 0.2 - 1.2 mg/dL 0.6  GFR >08.65 mL/min 71.51    Latest Reference Range & Units 05/28/22 11:23  Total CHOL/HDL Ratio  4  Cholesterol 0 - 200 mg/dL 784  HDL Cholesterol >69.62 mg/dL 95.28  LDL (calc) 0 - 99 mg/dL 413 (H)  MICROALB/CREAT RATIO 0.0 - 30.0 mg/g 0.7  NonHDL  125.17  Triglycerides 0.0 - 149.0 mg/dL 24.4  VLDL 0.0 - 01.0 mg/dL 27.2    Latest Reference Range & Units 05/28/22 11:23  Creatinine,U mg/dL 536.6  Microalb, Ur 0.0 - 1.9 mg/dL <4.4  MICROALB/CREAT RATIO 0.0 - 30.0 mg/g 0.7     ASSESSMENT / PLAN / RECOMMENDATIONS:   1) Type 2 Diabetes Mellitus, Optimally  controlled, Without complications - Most recent A1c of 5.7 %. Goal A1c <7.0%.    -I have praised the patient continued optimization of glucose control -She would like to increase Mounjaro -I have advised the patient to decrease metformin as below  MEDICATIONS: Decrease Metformin 500 mg XR, 1 tab daily Increase Mounjaro  10 mg weekly   EDUCATION / INSTRUCTIONS: BG monitoring instructions: Patient is instructed to check her blood sugars 2 times a day before meals Call [facility] if: BG persistently < 70  I reviewed the Rule of 15 for the treatment of hypoglycemia in detail with the patient. Literature supplied.    2) Diabetic complications:  Eye: Does  not have known diabetic retinopathy.  Neuro/ Feet: Does not have known diabetic peripheral neuropathy .  Renal: Patient does not have known baseline CKD. She   is not on an ACEI/ARB at present.     3) Dyslipidemia :  - We discussed the cardiovascular benefits as well as ADA recommendation and starting statin therapy - LDL ***     F/U in 6 months     Signed electronically by: Lyndle Herrlich, MD  [facility]  [facility] [address] [place] [place], [place] [address] Phone: [phone] FAX: [phone]   CC: Nelwyn Salisbury, MD [address] [place] [address] Phone: [phone]  Fax: [phone]  Return to Endocrinology clinic as below: Future Appointments  Date Time Provider Department Center  04/15/2023 11:10 AM Nellie Pester, Konrad Dolores, MD [facility] None

## 2023-04-15 NOTE — Patient Instructions (Signed)
  Decrease Metformin 500 mg XR, 1 tablet daily   Increase  Mounjaro 10  mg once weekly    HOW TO TREAT LOW BLOOD SUGARS (Blood sugar LESS THAN 70 MG/DL) Please follow the RULE OF 15 for the treatment of hypoglycemia treatment (when your (blood sugars are less than 70 mg/dL)   STEP 1: Take 15 grams of carbohydrates when your blood sugar is low, which includes:  3-4 GLUCOSE TABS  OR 3-4 OZ OF JUICE OR REGULAR SODA OR ONE TUBE OF GLUCOSE GEL    STEP 2: RECHECK blood sugar in 15 MINUTES STEP 3: If your blood sugar is still low at the 15 minute recheck --> then, go back to STEP 1 and treat AGAIN with another 15 grams of carbohydrates.

## 2023-04-18 ENCOUNTER — Telehealth: Payer: Self-pay | Admitting: Internal Medicine

## 2023-04-18 MED ORDER — POTASSIUM CHLORIDE ER 10 MEQ PO TBCR: 10.0000 meq | EXTENDED_RELEASE_TABLET | Freq: Every day | ORAL | 0 refills | Status: DC

## 2023-04-18 NOTE — Telephone Encounter (Signed)
Patient advised and will hold off on the Lipitor for now and will try to manage with lifestyle change.

## 2023-04-18 NOTE — Telephone Encounter (Signed)
Please let patient know that her blood work shows that her bad cholesterol is worse than it has been.  I would strongly encourage the patient to start taking Lipitor for cholesterol management.   Please let me know if she is okay with this I can prescribe it   Please let the patient know that her potassium was low, a prescription of a 30-day potassium supplement has been sent to the pharmacy  In the meantime, the patient needs to increase fluid intake that has high potassium such as avocados, green leafy vegetables such as spinach, kale as well as vegetables such as cucumbers and carrots

## 2023-06-20 ENCOUNTER — Other Ambulatory Visit: Payer: Self-pay | Admitting: Internal Medicine

## 2023-10-13 ENCOUNTER — Ambulatory Visit (INDEPENDENT_AMBULATORY_CARE_PROVIDER_SITE_OTHER): Payer: BC Managed Care – PPO | Admitting: Family Medicine

## 2023-10-13 ENCOUNTER — Encounter: Payer: Self-pay | Admitting: Family Medicine

## 2023-10-13 VITALS — BP 102/64 | HR 61 | Temp 98.3°F | Ht 66.0 in | Wt 184.0 lb

## 2023-10-13 DIAGNOSIS — Z0000 Encounter for general adult medical examination without abnormal findings: Secondary | ICD-10-CM

## 2023-10-13 DIAGNOSIS — D509 Iron deficiency anemia, unspecified: Secondary | ICD-10-CM

## 2023-10-13 DIAGNOSIS — E559 Vitamin D deficiency, unspecified: Secondary | ICD-10-CM

## 2023-10-13 LAB — BASIC METABOLIC PANEL
BUN: 15 mg/dL (ref 6–23)
CO2: 28 meq/L (ref 19–32)
Calcium: 8.9 mg/dL (ref 8.4–10.5)
Chloride: 103 meq/L (ref 96–112)
Creatinine, Ser: 0.81 mg/dL (ref 0.40–1.20)
GFR: 83.6 mL/min (ref >60.00)
Glucose, Bld: 82 mg/dL (ref 70–99)
Potassium: 3.7 meq/L (ref 3.5–5.1)
Sodium: 140 meq/L (ref 135–145)

## 2023-10-13 LAB — LIPID PANEL
Cholesterol: 139 mg/dL (ref 0–200)
HDL: 37.6 mg/dL — ABNORMAL LOW (ref >39.00)
LDL Cholesterol: 87 mg/dL (ref 0–99)
NonHDL: 101.41
Total CHOL/HDL Ratio: 4
Triglycerides: 72 mg/dL (ref 0.0–149.0)
VLDL: 14.4 mg/dL (ref 0.0–40.0)

## 2023-10-13 LAB — CBC WITH DIFFERENTIAL/PLATELET
Basophils Absolute: 0 10*3/uL (ref 0.0–0.1)
Basophils Relative: 0.8 % (ref 0.0–3.0)
Eosinophils Absolute: 0 10*3/uL (ref 0.0–0.7)
Eosinophils Relative: 0.4 % (ref 0.0–5.0)
HCT: 37.4 % (ref 36.0–46.0)
Hemoglobin: 12.8 g/dL (ref 12.0–15.0)
Lymphocytes Relative: 53.8 % — ABNORMAL HIGH (ref 12.0–46.0)
Lymphs Abs: 2.4 10*3/uL (ref 0.7–4.0)
MCHC: 34.2 g/dL (ref 30.0–36.0)
MCV: 92.2 fL (ref 78.0–100.0)
Monocytes Absolute: 0.5 10*3/uL (ref 0.1–1.0)
Monocytes Relative: 10.9 % (ref 3.0–12.0)
Neutro Abs: 1.5 10*3/uL (ref 1.4–7.7)
Neutrophils Relative %: 34.1 % — ABNORMAL LOW (ref 43.0–77.0)
Platelets: 220 10*3/uL (ref 150.0–400.0)
RBC: 4.06 Mil/uL (ref 3.87–5.11)
RDW: 13.7 % (ref 11.5–15.5)
WBC: 4.4 10*3/uL (ref 4.0–10.5)

## 2023-10-13 LAB — IBC + FERRITIN
Ferritin: 29.3 ng/mL (ref 10.0–291.0)
Iron: 40 ug/dL — ABNORMAL LOW (ref 42–145)
Saturation Ratios: 12.3 % — ABNORMAL LOW (ref 20.0–50.0)
TIBC: 326.2 ug/dL (ref 250.0–450.0)
Transferrin: 233 mg/dL (ref 212.0–360.0)

## 2023-10-13 LAB — HEPATIC FUNCTION PANEL
ALT: 14 U/L (ref 0–35)
AST: 20 U/L (ref 0–37)
Albumin: 4.1 g/dL (ref 3.5–5.2)
Alkaline Phosphatase: 72 U/L (ref 39–117)
Bilirubin, Direct: 0.1 mg/dL (ref 0.0–0.3)
Total Bilirubin: 0.6 mg/dL (ref 0.2–1.2)
Total Protein: 7.8 g/dL (ref 6.0–8.3)

## 2023-10-13 LAB — TSH: TSH: 1.67 u[IU]/mL (ref 0.35–5.50)

## 2023-10-13 LAB — HEMOGLOBIN A1C: Hgb A1c MFr Bld: 5.9 % (ref 4.6–6.5)

## 2023-10-13 LAB — VITAMIN D 25 HYDROXY (VIT D DEFICIENCY, FRACTURES): VITD: 18.97 ng/mL — ABNORMAL LOW (ref 30.00–100.00)

## 2023-10-13 MED ORDER — IRON (FERROUS SULFATE) 325 (65 FE) MG PO TABS: 325.0000 mg | ORAL_TABLET | Freq: Every day | ORAL | 3 refills | Status: AC

## 2023-10-13 MED ORDER — POTASSIUM CHLORIDE ER 10 MEQ PO TBCR: 10.0000 meq | EXTENDED_RELEASE_TABLET | Freq: Every day | ORAL | 3 refills | Status: AC

## 2023-10-13 MED ORDER — IBUPROFEN 800 MG PO TABS: 800.0000 mg | ORAL_TABLET | Freq: Three times a day (TID) | ORAL | 5 refills | Status: AC | PRN

## 2023-10-13 MED ORDER — HYDROCOD POLI-CHLORPHE POLI ER 10-8 MG/5ML PO SUER: 5.0000 mL | Freq: Two times a day (BID) | ORAL | 0 refills | Status: DC | PRN

## 2023-10-13 MED ORDER — VITAMIN D (ERGOCALCIFEROL) 1.25 MG (50000 UNIT) PO CAPS: 50000.0000 [IU] | ORAL_CAPSULE | ORAL | 3 refills | Status: AC

## 2023-10-13 MED ORDER — ALBUTEROL SULFATE HFA 108 (90 BASE) MCG/ACT IN AERS: 2.0000 | INHALATION_SPRAY | RESPIRATORY_TRACT | 2 refills | Status: AC | PRN

## 2023-10-13 NOTE — Progress Notes (Signed)
   Subjective:    Patient ID: Tammy Delgado [place], female    DOB: 25-Aug-1971, 53 y.o.   MRN: [id number]  HPI Here for a well exam. She feels well. She sees Dr. Sam for her diabetes. Her last A1c in August was 5.7%.    Review of Systems  Constitutional: Negative.   HENT: Negative.    Eyes: Negative.   Respiratory: Negative.    Cardiovascular: Negative.   Gastrointestinal: Negative.   Genitourinary:  Negative for decreased urine volume, difficulty urinating, dyspareunia, dysuria, enuresis, flank pain, frequency, hematuria, pelvic pain and urgency.  Musculoskeletal: Negative.   Skin: Negative.   Neurological: Negative.  Negative for headaches.  Psychiatric/Behavioral: Negative.         Objective:   Physical Exam Constitutional:      General: She is not in acute distress.    Appearance: Normal appearance. She is well-developed.  HENT:     Head: Normocephalic and atraumatic.     Right Ear: External ear normal.     Left Ear: External ear normal.     Nose: Nose normal.     Mouth/Throat:     Pharynx: No oropharyngeal exudate.  Eyes:     General: No scleral icterus.    Conjunctiva/sclera: Conjunctivae normal.     Pupils: Pupils are equal, round, and reactive to light.  Neck:     Thyroid : No thyromegaly.     Vascular: No JVD.  Cardiovascular:     Rate and Rhythm: Normal rate and regular rhythm.     Pulses: Normal pulses.     Heart sounds: Normal heart sounds. No murmur heard.    No friction rub. No gallop.  Pulmonary:     Effort: Pulmonary effort is normal. No respiratory distress.     Breath sounds: Normal breath sounds. No wheezing or rales.  Chest:     Chest wall: No tenderness.  Abdominal:     General: Bowel sounds are normal. There is no distension.     Palpations: Abdomen is soft. There is no mass.     Tenderness: There is no abdominal tenderness. There is no guarding or rebound.  Musculoskeletal:        General: No tenderness. Normal range of motion.      Cervical back: Normal range of motion and neck supple.  Lymphadenopathy:     Cervical: No cervical adenopathy.  Skin:    General: Skin is warm and dry.     Findings: No erythema or rash.  Neurological:     General: No focal deficit present.     Mental Status: She is alert and oriented to person, place, and time.     Cranial Nerves: No cranial nerve deficit.     Motor: No abnormal muscle tone.     Coordination: Coordination normal.     Deep Tendon Reflexes: Reflexes are normal and symmetric. Reflexes normal.  Psychiatric:        Mood and Affect: Mood normal.        Behavior: Behavior normal.        Thought Content: Thought content normal.        Judgment: Judgment normal.           Assessment & Plan:  Well exam. We discussed diet and exercise. Get fasting labs. Set up her first colonoscopy.  Garnette Olmsted, MD

## 2023-10-17 ENCOUNTER — Encounter: Payer: Self-pay | Admitting: Internal Medicine

## 2023-10-17 ENCOUNTER — Ambulatory Visit: Payer: BC Managed Care – PPO | Admitting: Internal Medicine

## 2023-10-17 VITALS — BP 120/74 | HR 73 | Ht 66.0 in | Wt 191.0 lb

## 2023-10-17 DIAGNOSIS — E1165 Type 2 diabetes mellitus with hyperglycemia: Secondary | ICD-10-CM

## 2023-10-17 DIAGNOSIS — Z794 Long term (current) use of insulin: Secondary | ICD-10-CM | POA: Diagnosis not present

## 2023-10-17 LAB — POCT GLUCOSE (DEVICE FOR HOME USE): POC Glucose: 121 mg/dL — AB (ref 70–99)

## 2023-10-17 MED ORDER — TIRZEPATIDE 12.5 MG/0.5ML ~~LOC~~ SOAJ: 12.5000 mg | SUBCUTANEOUS | 3 refills | Status: DC

## 2023-10-17 NOTE — Progress Notes (Signed)
 Name: Tammy Delgado  Age/ Sex: 54 y.o., female   MRN/ DOB: [id number], 03/11/1971     PCP: Donley Furth, MD   Reason for Endocrinology Evaluation: Type 2 Diabetes Mellitus  Initial Endocrine Consultative Visit: 01/16/2021    PATIENT IDENTIFIER: Tammy Delgado is a 53 y.o. female with a past medical history of T2DM, OSA. The patient has followed with Endocrinology clinic since 01/16/2021 for consultative assistance with management of her diabetes.  DIABETIC HISTORY:  Tammy Delgado was diagnosed with DM 2014, has been on insulin  since her diagnosis. Her hemoglobin A1c has ranged from 8.7% in 2022, peaking at 11.9% in 2014.    The patient had follow-up With Dr. Washington Hacker from May 2022 until August 2022  Of note she was also seen by Dr. Aldona Amel in 2014   Mounjaro  prescribed 05/2022 with an A1c of 9.9%  NovoLog  Mix was discontinued 10/2022 with an A1c of 6.4%  She was offered atorvastatin 04/2023.  Patient opted with lifestyle changes  SUBJECTIVE:   During the last visit (04/15/2023): A1c 5.7%  Today (10/17/2023): Tammy Delgado is here for follow-up on diabetes management.  She checks her blood sugars occasionally . The patient has not had hypoglycemic episodes since the last clinic visit  Patient continues with weight loss Denies nausea or vomiting  Denies  diarrhea, uses Miralax for constipation    HOME DIABETES REGIMEN:  Metformin  500 mg XR, 1 tab daily  Mounjaro  10 mg weekly     Statin: No ACE-I/ARB: No    METER DOWNLOAD SUMMARY: n/a   DIABETIC COMPLICATIONS: Microvascular complications:  Denies: CKD Last Eye Exam: scheduled end 04/2023  Macrovascular complications:   Denies: CAD, CVA, PVD   HISTORY:  Past Medical History:  Past Medical History:  Diagnosis Date   Anemia    Diabetes mellitus without complication (HCC)    sees Dr. Rosalea Collin   SOBOE (shortness of breath on exertion)    Past Surgical History:  Past Surgical History:  Procedure  Laterality Date   CESAREAN SECTION  1995 and 2007   Social History:  reports that she has never smoked. She has never used smokeless tobacco. She reports that she does not drink alcohol and does not use drugs. Family History:  Family History  Problem Relation Age of Onset   Diabetes Mother    Hypertension Mother    Cancer Mother    Hypertension Maternal Grandmother    Diabetes Maternal Grandmother    Hypertension Father    Cancer Father      HOME MEDICATIONS: Allergies as of 10/17/2023       Reactions   Chicken Allergy Other (See Comments)        Medication List        Accurate as of October 17, 2023 10:59 AM. If you have any questions, ask your nurse or doctor.          albuterol  (2.5 MG/3ML) 0.083% nebulizer solution Commonly known as: PROVENTIL  Take 3 mLs (2.5 mg total) by nebulization every 6 (six) hours as needed for wheezing or shortness of breath.   albuterol  108 (90 Base) MCG/ACT inhaler Commonly known as: ProAir  HFA Inhale 2 puffs into the lungs every 4 (four) hours as needed for wheezing or shortness of breath.   chlorpheniramine-HYDROcodone  10-8 MG/5ML Commonly known as: TUSSIONEX Take 5 mLs by mouth every 12 (twelve) hours as needed for cough.   ibuprofen  800 MG tablet Commonly known as: ADVIL  Take 1 tablet (800 mg total) by mouth  every 8 (eight) hours as needed for mild pain (pain score 1-3).   Iron  (Ferrous Sulfate ) 325 (65 Fe) MG Tabs Take 325 mg by mouth daily.   metFORMIN  500 MG 24 hr tablet Commonly known as: GLUCOPHAGE -XR Take 1 tablet (500 mg total) by mouth daily.   potassium chloride  10 MEQ tablet Commonly known as: KLOR-CON  Take 1 tablet (10 mEq total) by mouth daily.   tirzepatide  10 MG/0.5ML Pen Commonly known as: MOUNJARO  Inject 10 mg into the skin once a week.   Vitamin D  (Ergocalciferol ) 1.25 MG (50000 UNIT) Caps capsule Commonly known as: DRISDOL  Take 1 capsule (50,000 Units total) by mouth every 7 (seven) days.          OBJECTIVE:   Vital Signs: BP 120/74 (BP Location: Left Arm, Patient Position: Sitting, Cuff Size: Normal)   Pulse 73   Ht 5\' 6"  (1.676 m)   Wt 191 lb (86.6 kg)   SpO2 99%   BMI 30.83 kg/m   Wt Readings from Last 3 Encounters:  10/17/23 191 lb (86.6 kg)  10/13/23 184 lb (83.5 kg)  04/15/23 200 lb 12.8 oz (91.1 kg)     Exam: General: Pt appears well and is in NAD  Lungs: Clear with good BS bilat   Heart: RRR   Abdomen:  soft, nontender  Extremities: No pretibial edema.   Neuro: MS is good with appropriate affect, pt is alert and Ox3    DM foot exam: 04/15/2023  The skin of the feet is intact without sores or ulcerations. The pedal pulses are 2+ on right and 2+ on left. The sensation is intact to a screening 5.07, 10 gram monofilament bilaterally        DATA REVIEWED:  Lab Results  Component Value Date   HGBA1C 5.9 10/13/2023   HGBA1C 5.7 (A) 04/15/2023   HGBA1C 6.4 (A) 10/13/2022    Latest Reference Range & Units 10/13/23 10:24  Sodium 135 - 145 mEq/L 140  Potassium 3.5 - 5.1 mEq/L 3.7  Chloride 96 - 112 mEq/L 103  CO2 19 - 32 mEq/L 28  Glucose 70 - 99 mg/dL 82  BUN 6 - 23 mg/dL 15  Creatinine 1.61 - 0.96 mg/dL 0.45  Calcium 8.4 - 40.9 mg/dL 8.9  Alkaline Phosphatase 39 - 117 U/L 72  Albumin 3.5 - 5.2 g/dL 4.1  AST 0 - 37 U/L 20  ALT 0 - 35 U/L 14  Total Protein 6.0 - 8.3 g/dL 7.8  Bilirubin, Direct 0.0 - 0.3 mg/dL 0.1  Total Bilirubin 0.2 - 1.2 mg/dL 0.6  GFR >81.19 mL/min 83.60    Latest Reference Range & Units 10/13/23 10:24  Total CHOL/HDL Ratio  4  Cholesterol 0 - 200 mg/dL 147  HDL Cholesterol >82.95 mg/dL 62.13 (L)  LDL (calc) 0 - 99 mg/dL 87  NonHDL  086.57  Triglycerides 0.0 - 149.0 mg/dL 84.6  VLDL 0.0 - 96.2 mg/dL 95.2  (L): Data is abnormally low  Old records , labs and images have been reviewed.    ASSESSMENT / PLAN / RECOMMENDATIONS:   1) Type 2 Diabetes Mellitus, Optimally  controlled, Without complications - Most recent  A1c of 5.9 %. Goal A1c <7.0%.    -A1c remains optimal  -She would like to increase Mounjaro  -Will discontinue Metformin      MEDICATIONS: Stop Metformin  500 mg XR, 1 tab daily Increase Mounjaro  12.5 mg weekly   EDUCATION / INSTRUCTIONS: BG monitoring instructions: Patient is instructed to check her blood sugars 2 times a  day before meals Call [facility] if: BG persistently < 70  I reviewed the Rule of 15 for the treatment of hypoglycemia in detail with the patient. Literature supplied.    2) Diabetic complications:  Eye: Does not have known diabetic retinopathy.  Neuro/ Feet: Does not have known diabetic peripheral neuropathy .  Renal: Patient does not have known baseline CKD. She   is not on an ACEI/ARB at present.     3) Dyslipidemia :  -Historically she has had elevated LDL, atorvastatin was offered, but the patient opted to focus on lifestyle changes with dramatic improvement in LDL at 87 Mg/DL by 09/10/7827    F/U in 6 months     Signed electronically by: Natale Bail, MD  [facility]  [facility] [address] [place] [place], [place] [address] Phone: [phone] FAX: [phone]   CC: Donley Furth, MD [address] [place] [address] Phone: [phone]  Fax: [phone]  Return to Endocrinology clinic as below: No future appointments.

## 2023-10-17 NOTE — Patient Instructions (Signed)
 STOP  Metformin  500 mg XR, 1 tablet daily   Increase  Mounjaro  12.5  mg once weekly    HOW TO TREAT LOW BLOOD SUGARS (Blood sugar LESS THAN 70 MG/DL) Please follow the RULE OF 15 for the treatment of hypoglycemia treatment (when your (blood sugars are less than 70 mg/dL)   STEP 1: Take 15 grams of carbohydrates when your blood sugar is low, which includes:  3-4 GLUCOSE TABS  OR 3-4 OZ OF JUICE OR REGULAR SODA OR ONE TUBE OF GLUCOSE GEL    STEP 2: RECHECK blood sugar in 15 MINUTES STEP 3: If your blood sugar is still low at the 15 minute recheck --> then, go back to STEP 1 and treat AGAIN with another 15 grams of carbohydrates.

## 2023-10-27 ENCOUNTER — Other Ambulatory Visit: Payer: Self-pay | Admitting: Family Medicine

## 2023-10-31 ENCOUNTER — Other Ambulatory Visit: Payer: Self-pay | Admitting: Family Medicine

## 2023-10-31 NOTE — Telephone Encounter (Signed)
 Last Fill: 10/13/23  Last OV: 10/13/23 Next OV: None Scheduled  Routing to provider for review/authorization.

## 2023-10-31 NOTE — Telephone Encounter (Signed)
 Copied from CRM [id number]. Topic: Clinical - Medication Refill >> Oct 31, 2023  2:51 PM Sim Boast F wrote: Most Recent Primary Care Visit:  Provider: Gershon Crane A  Department: [facility]  Visit Type: PHYSICAL  Date: 10/13/2023  Medication: Chlorpheniramine-HYDROcodone (TUSSIONEX) 10-8 MG/5ML & new script for the amoxicillin clavulanate  Has the patient contacted their pharmacy? Yes (Agent: If no, request that the patient contact the pharmacy for the refill. If patient does not wish to contact the pharmacy document the reason why and proceed with request.) (Agent: If yes, when and what did the pharmacy advise?)  Is this the correct pharmacy for this prescription? Yes If no, delete pharmacy and type the correct one.  This is the patient's preferred pharmacy:   [facility] [address] [place], [place] - [address] [place] [address] Phone: [phone] Fax: [phone]   Has the prescription been filled recently? Yes  Is the patient out of the medication? Yes  Has the patient been seen for an appointment in the last year OR does the patient have an upcoming appointment? Yes  Can we respond through MyChart? Yes  Agent: Please be advised that Rx refills may take up to 3 business days. We ask that you follow-up with your pharmacy.

## 2023-11-01 ENCOUNTER — Telehealth: Payer: Self-pay

## 2023-11-01 ENCOUNTER — Other Ambulatory Visit: Payer: Self-pay | Admitting: Family Medicine

## 2023-11-01 NOTE — Telephone Encounter (Signed)
 Copied from CRM [id number]. Topic: Clinical - Medication Question >> Nov 01, 2023  4:47 PM Alcus Dad wrote: Reason for CRM: Patient stated that Dr. Clent Ridges was suppose to send her in an antibiotic alone with cough syrup to drug store. Pharmist stated that they have not received anything yet

## 2023-11-01 NOTE — Telephone Encounter (Unsigned)
 Copied from CRM [id number]. Topic: Clinical - Medication Refill >> Nov 01, 2023  4:42 PM Alcus Dad wrote: Most Recent Primary Care Visit:  Provider: Gershon Crane A  Department: [facility]  Visit Type: PHYSICAL  Date: 10/13/2023  Medication: chlorpheniramine-HYDROcodone (TUSSIONEX) 10-8 MG/5ML   Has the patient contacted their pharmacy? Yes (Agent: If no, request that the patient contact the pharmacy for the refill. If patient does not wish to contact the pharmacy document the reason why and proceed with request.) (Agent: If yes, when and what did the pharmacy advise?)  Is this the correct pharmacy for this prescription? Yes If no, delete pharmacy and type the correct one.  This is the patient's preferred pharmacy:  [facility] [address] [place], [place] - [address] [place] [address] Phone: [phone] Fax: [phone]  [facility] [address], [place] - [address] [place] [address] Phone: [phone] Fax: [phone]   Has the prescription been filled recently? No  Is the patient out of the medication? Yes  Has the patient been seen for an appointment in the last year OR does the patient have an upcoming appointment? Yes  Can we respond through MyChart? Yes  Agent: Please be advised that Rx refills may take up to 3 business days. We ask that you follow-up with your pharmacy.

## 2023-11-01 NOTE — Telephone Encounter (Signed)
 Copied from CRM [id number]. Topic: Clinical - Medication Refill >> Nov 01, 2023  4:42 PM Alcus Dad wrote: Most Recent Primary Care Visit:  Provider: Gershon Crane A  Department: [facility]  Visit Type: PHYSICAL  Date: 10/13/2023  Medication: chlorpheniramine-HYDROcodone (TUSSIONEX) 10-8 MG/5ML   Has the patient contacted their pharmacy? Yes (Agent: If no, request that the patient contact the pharmacy for the refill. If patient does not wish to contact the pharmacy document the reason why and proceed with request.) (Agent: If yes, when and what did the pharmacy advise?)  Is this the correct pharmacy for this prescription? Yes If no, delete pharmacy and type the correct one.  This is the patient's preferred pharmacy:  [facility] [address] [place], [place] - [address] [place] [address] Phone: [phone] Fax: [phone]  [facility] [address], [place] - [address] [place] [address] Phone: [phone] Fax: [phone]   Has the prescription been filled recently? No  Is the patient out of the medication? Yes  Has the patient been seen for an appointment in the last year OR does the patient have an upcoming appointment? Yes  Can we respond through MyChart? Yes  Agent: Please be advised that Rx refills may take up to 3 business days. We ask that you follow-up with your pharmacy.

## 2023-11-02 NOTE — Telephone Encounter (Signed)
 Duplicate message sent to PCP for advise

## 2023-11-03 MED ORDER — HYDROCOD POLI-CHLORPHE POLI ER 10-8 MG/5ML PO SUER: 5.0000 mL | Freq: Two times a day (BID) | ORAL | 0 refills | Status: DC | PRN

## 2023-11-03 NOTE — Addendum Note (Signed)
 Addended by: Gershon Crane A on: 11/03/2023 12:49 PM   Modules accepted: Orders

## 2023-11-03 NOTE — Telephone Encounter (Signed)
 Done

## 2024-04-16 ENCOUNTER — Ambulatory Visit: Payer: BC Managed Care – PPO | Admitting: Internal Medicine

## 2024-04-16 NOTE — Progress Notes (Deleted)
 Name: Tammy Delgado  Age/ Sex: 53 y.o., female   MRN/ DOB: [id number], Jan 07, 1971     PCP: Johnny Garnette LABOR, MD   Reason for Endocrinology Evaluation: Type 2 Diabetes Mellitus  Initial Endocrine Consultative Visit: 01/16/2021    PATIENT IDENTIFIER: Tammy Delgado is a 53 y.o. female with a past medical history of T2DM, OSA. The patient has followed with Endocrinology clinic since 01/16/2021 for consultative assistance with management of her diabetes.  DIABETIC HISTORY:  Tammy Delgado was diagnosed with DM 2014, has been on insulin  since her diagnosis. Her hemoglobin A1c has ranged from 8.7% in 2022, peaking at 11.9% in 2014.    The patient had follow-up With Dr. Kassie from May 2022 until August 2022  Of note she was also seen by Dr. Trixie in 2014   Mounjaro  prescribed 05/2022 with an A1c of 9.9%  NovoLog  Mix was discontinued 10/2022 with an A1c of 6.4%  She was offered atorvastatin 04/2023.  Patient opted with lifestyle changes  Discontinue metformin  and continued Mounjaro  with an A1c of 5.9% February, 2025   SUBJECTIVE:   During the last visit (04/15/2023): A1c 5.7%  Today (04/16/2024): Tammy Delgado is here for follow-up on diabetes management.  She checks her blood sugars occasionally . The patient has not had hypoglycemic episodes since the last clinic visit  Patient continues with weight loss Denies nausea or vomiting  Denies  diarrhea, uses Miralax for constipation     HOME DIABETES REGIMEN:  Mounjaro  12.5 mg weekly     Statin: No ACE-I/ARB: No    METER DOWNLOAD SUMMARY: n/a   DIABETIC COMPLICATIONS: Microvascular complications:  Denies: CKD Last Eye Exam: scheduled end 04/2023  Macrovascular complications:   Denies: CAD, CVA, PVD   HISTORY:  Past Medical History:  Past Medical History:  Diagnosis Date   Anemia    Diabetes mellitus without complication (HCC)    sees Dr. Sam   SOBOE (shortness of breath on exertion)    Past  Surgical History:  Past Surgical History:  Procedure Laterality Date   CESAREAN SECTION  1995 and 2007   Social History:  reports that she has never smoked. She has never used smokeless tobacco. She reports that she does not drink alcohol and does not use drugs. Family History:  Family History  Problem Relation Age of Onset   Diabetes Mother    Hypertension Mother    Cancer Mother    Hypertension Maternal Grandmother    Diabetes Maternal Grandmother    Hypertension Father    Cancer Father      HOME MEDICATIONS: Allergies as of 04/16/2024       Reactions   Chicken Allergy Other (See Comments)        Medication List        Accurate as of April 16, 2024  7:00 AM. If you have any questions, ask your nurse or doctor.          albuterol  (2.5 MG/3ML) 0.083% nebulizer solution Commonly known as: PROVENTIL  Take 3 mLs (2.5 mg total) by nebulization every 6 (six) hours as needed for wheezing or shortness of breath.   albuterol  108 (90 Base) MCG/ACT inhaler Commonly known as: ProAir  HFA Inhale 2 puffs into the lungs every 4 (four) hours as needed for wheezing or shortness of breath.   chlorpheniramine-HYDROcodone  10-8 MG/5ML Commonly known as: TUSSIONEX Take 5 mLs by mouth every 12 (twelve) hours as needed for cough.   ibuprofen  800 MG tablet Commonly known as: ADVIL   Take 1 tablet (800 mg total) by mouth every 8 (eight) hours as needed for mild pain (pain score 1-3).   Iron  (Ferrous Sulfate ) 325 (65 Fe) MG Tabs Take 325 mg by mouth daily.   potassium chloride  10 MEQ tablet Commonly known as: KLOR-CON  Take 1 tablet (10 mEq total) by mouth daily.   tirzepatide  12.5 MG/0.5ML Pen Commonly known as: MOUNJARO  Inject 12.5 mg into the skin once a week.   Vitamin D  (Ergocalciferol ) 1.25 MG (50000 UNIT) Caps capsule Commonly known as: DRISDOL  Take 1 capsule (50,000 Units total) by mouth every 7 (seven) days.         OBJECTIVE:   Vital Signs: There were no vitals  taken for this visit.  Wt Readings from Last 3 Encounters:  10/17/23 191 lb (86.6 kg)  10/13/23 184 lb (83.5 kg)  04/15/23 200 lb 12.8 oz (91.1 kg)     Exam: General: Pt appears well and is in NAD  Lungs: Clear with good BS bilat   Heart: RRR   Abdomen:  soft, nontender  Extremities: No pretibial edema.   Neuro: MS is good with appropriate affect, pt is alert and Ox3    DM foot exam: 04/15/2023  The skin of the feet is intact without sores or ulcerations. The pedal pulses are 2+ on right and 2+ on left. The sensation is intact to a screening 5.07, 10 gram monofilament bilaterally        DATA REVIEWED:  Lab Results  Component Value Date   HGBA1C 5.9 10/13/2023   HGBA1C 5.7 (A) 04/15/2023   HGBA1C 6.4 (A) 10/13/2022    Latest Reference Range & Units 10/13/23 10:24  Sodium 135 - 145 mEq/L 140  Potassium 3.5 - 5.1 mEq/L 3.7  Chloride 96 - 112 mEq/L 103  CO2 19 - 32 mEq/L 28  Glucose 70 - 99 mg/dL 82  BUN 6 - 23 mg/dL 15  Creatinine 9.59 - 8.79 mg/dL 9.18  Calcium 8.4 - 89.4 mg/dL 8.9  Alkaline Phosphatase 39 - 117 U/L 72  Albumin 3.5 - 5.2 g/dL 4.1  AST 0 - 37 U/L 20  ALT 0 - 35 U/L 14  Total Protein 6.0 - 8.3 g/dL 7.8  Bilirubin, Direct 0.0 - 0.3 mg/dL 0.1  Total Bilirubin 0.2 - 1.2 mg/dL 0.6  GFR >39.99 mL/min 83.60    Latest Reference Range & Units 10/13/23 10:24  Total CHOL/HDL Ratio  4  Cholesterol 0 - 200 mg/dL 860  HDL Cholesterol >60.99 mg/dL 62.39 (L)  LDL (calc) 0 - 99 mg/dL 87  NonHDL  898.58  Triglycerides 0.0 - 149.0 mg/dL 27.9  VLDL 0.0 - 59.9 mg/dL 85.5  (L): Data is abnormally low  Old records , labs and images have been reviewed.    ASSESSMENT / PLAN / RECOMMENDATIONS:   1) Type 2 Diabetes Mellitus, Optimally  controlled, Without complications - Most recent A1c of 5.9 %. Goal A1c <7.0%.    -A1c remains optimal  -She would like to increase Mounjaro  -Will discontinue Metformin      MEDICATIONS: Stop Metformin  500 mg XR, 1 tab  daily Increase Mounjaro  12.5 mg weekly   EDUCATION / INSTRUCTIONS: BG monitoring instructions: Patient is instructed to check her blood sugars 2 times a day before meals Call [place] [facility] if: BG persistently < 70  I reviewed the Rule of 15 for the treatment of hypoglycemia in detail with the patient. Literature supplied.    2) Diabetic complications:  Eye: Does not have known diabetic retinopathy.  Neuro/ Feet: Does not have known diabetic peripheral neuropathy .  Renal: Patient does not have known baseline CKD. She   is not on an ACEI/ARB at present.     3) Dyslipidemia :  -Historically she has had elevated LDL, atorvastatin was offered, but the patient opted to focus on lifestyle changes with dramatic improvement in LDL at 87 Mg/DL by 03/08/7973    F/U in 6 months     Signed electronically by: Stefano Redgie Butts, MD  [facility] Endocrinology  [facility] [address] [place] [place], [place] [address] Phone: [phone] FAX: [phone]   CC: Johnny Garnette LABOR, MD [address] [place] [address] Phone: [phone]  Fax: [phone]  Return to Endocrinology clinic as below: Future Appointments  Date Time Provider Department Center  04/16/2024 10:10 AM Hattie Pine, Donell Redgie, MD [facility] None

## 2024-04-20 ENCOUNTER — Ambulatory Visit: Admitting: Internal Medicine

## 2024-04-26 ENCOUNTER — Telehealth: Payer: Self-pay | Admitting: Family Medicine

## 2024-04-26 ENCOUNTER — Ambulatory Visit: Admitting: Family Medicine

## 2024-04-26 ENCOUNTER — Encounter: Payer: Self-pay | Admitting: Family Medicine

## 2024-04-26 VITALS — BP 110/72 | HR 95 | Temp 96.7°F | Ht 66.0 in | Wt 201.4 lb

## 2024-04-26 DIAGNOSIS — R062 Wheezing: Secondary | ICD-10-CM | POA: Diagnosis not present

## 2024-04-26 DIAGNOSIS — J029 Acute pharyngitis, unspecified: Secondary | ICD-10-CM | POA: Diagnosis not present

## 2024-04-26 DIAGNOSIS — U071 COVID-19: Secondary | ICD-10-CM | POA: Diagnosis not present

## 2024-04-26 DIAGNOSIS — R051 Acute cough: Secondary | ICD-10-CM | POA: Diagnosis not present

## 2024-04-26 LAB — POCT RAPID STREP A (OFFICE): Rapid Strep A Screen: NEGATIVE

## 2024-04-26 LAB — POC COVID19 BINAXNOW: SARS Coronavirus 2 Ag: POSITIVE — AB

## 2024-04-26 MED ORDER — ALBUTEROL SULFATE HFA 108 (90 BASE) MCG/ACT IN AERS: 2.0000 | INHALATION_SPRAY | RESPIRATORY_TRACT | 0 refills | Status: AC | PRN

## 2024-04-26 MED ORDER — NIRMATRELVIR/RITONAVIR (PAXLOVID)TABLET: 3.0000 | ORAL_TABLET | Freq: Two times a day (BID) | ORAL | 0 refills | Status: AC

## 2024-04-26 MED ORDER — BENZONATATE 100 MG PO CAPS: 100.0000 mg | ORAL_CAPSULE | Freq: Two times a day (BID) | ORAL | 0 refills | Status: DC | PRN

## 2024-04-26 NOTE — Telephone Encounter (Signed)
 Pt's husband requesting a z-pak, for Delgado treatment of covid. Requests a call. Tammy Delgado, saw Mercer today

## 2024-04-26 NOTE — Progress Notes (Signed)
 Established Patient Office Visit   Subjective  Patient ID: Tammy Delgado, female    DOB: Mar 13, 1971  Age: 53 y.o. MRN: [id number]  Chief Complaint  Patient presents with   Acute Visit    Patient came in today for a cough, congestion, haedache, runny nose, wheezing, sore throat, fever, chills, which started 3 days ago   Pt accompanied by her husband.  Pt is a 53 yo female seen for acute concern.  Pt with dry cough, HA, fever tmax 103F, chills, ST, wheezing, rhinorrhea, post-nasal drainage x 2 days.  Pt also with decreased appetite and not drinking much.  Denies diarrhea, n/v, sick contacts.  Tried nyquil and mucinex for sx.  Not using albuterol  inhaler, states its old/hasn't had in yrs.    Patient Active Problem List   Diagnosis Date Noted   Dyslipidemia 05/28/2022   Type 2 diabetes mellitus with hyperglycemia, with long-term current use of insulin  (HCC) 05/28/2022   Pneumonia 09/15/2021   Acute bronchitis 08/25/2021   Abnormal CT of the chest 08/25/2021   Other fatigue 02/03/2021   SOBOE (shortness of breath on exertion) 02/03/2021   Diabetes mellitus (HCC) 02/03/2021   Snoring, nightly 02/03/2021   Eating disorder 02/03/2021   Type 2 diabetes mellitus treated with insulin  (HCC) 09/27/2017   MORBID OBESITY 07/23/2008   ANEMIA-NOS 07/23/2008   Past Medical History:  Diagnosis Date   Anemia    Diabetes mellitus without complication (HCC)    sees Dr. Sam   SOBOE (shortness of breath on exertion)    Past Surgical History:  Procedure Laterality Date   CESAREAN SECTION  1995 and 2007   Social History   Tobacco Use   Smoking status: Never   Smokeless tobacco: Never  Vaping Use   Vaping status: Never Used  Substance Use Topics   Alcohol use: No   Drug use: No   Family History  Problem Relation Age of Onset   Diabetes Mother    Hypertension Mother    Cancer Mother    Hypertension Maternal Grandmother    Diabetes Maternal Grandmother    Hypertension  Father    Cancer Father    Allergies  Allergen Reactions   Chicken Allergy Other (See Comments)    ROS Negative unless stated above    Objective:     There were no vitals taken for this visit. BP Readings from Last 3 Encounters:  10/17/23 120/74  10/13/23 102/64  04/15/23 114/72   Wt Readings from Last 3 Encounters:  10/17/23 191 lb (86.6 kg)  10/13/23 184 lb (83.5 kg)  04/15/23 200 lb 12.8 oz (91.1 kg)      Physical Exam Constitutional:      General: She is not in acute distress.    Appearance: Normal appearance.  HENT:     Head: Normocephalic and atraumatic.     Nose: Nose normal.  Cardiovascular:     Rate and Rhythm: Normal rate and regular rhythm.     Heart sounds: Normal heart sounds. No murmur heard.    No gallop.  Pulmonary:     Effort: Pulmonary effort is normal. No respiratory distress.     Breath sounds: Wheezing present. No rhonchi or rales.  Musculoskeletal:        General: Normal range of motion.  Skin:    General: Skin is warm and dry.  Neurological:     Mental Status: She is alert and oriented to person, place, and time.    Results for orders placed  or performed in visit on 04/26/24  POC COVID-19 BinaxNow  Result Value Ref Range   SARS Coronavirus 2 Ag Positive (A) Negative  POCT rapid strep A  Result Value Ref Range   Rapid Strep A Screen Negative Negative      Assessment & Plan:   COVID-19 virus infection -     nirmatrelvir /ritonavir ; Take 3 tablets by mouth 2 (two) times daily for 5 days. (Take nirmatrelvir  150 mg two tablets twice daily for 5 days and ritonavir  100 mg one tablet twice daily for 5 days) Patient GFR is 83  Dispense: 30 tablet; Refill: 0  Sore throat -     POC COVID-19 BinaxNow -     POCT rapid strep A  Acute cough -     POC COVID-19 BinaxNow -     POCT rapid strep A -     Benzonatate ; Take 1 capsule (100 mg total) by mouth 2 (two) times daily as needed for cough.  Dispense: 20 capsule; Refill: 0  Wheezing -      Albuterol  Sulfate HFA; Inhale 2 puffs into the lungs every 4 (four) hours as needed for wheezing or shortness of breath.  Dispense: 8 g; Refill: 0  Acute URI sx x 2 days.  POC COVID test positive.  POC strep test negative.  Supportive care.  Discussed the importance of hydration.  Discussed r/b/a of antiviral med and possible cost.  Rx for tessalon  and paxlovid  sent to pharmacy.  Per chart review pt has refills on albuterol  inhaler from earlier this yr.  New rx sent.  Given strict precautions.  Return if symptoms worsen or fail to improve.   Clotilda JONELLE Single, MD

## 2024-04-27 NOTE — Telephone Encounter (Signed)
 Spoke with patient z-pak is not used for Covid, patient is aware

## 2024-05-31 ENCOUNTER — Ambulatory Visit: Admitting: Internal Medicine

## 2024-06-14 ENCOUNTER — Ambulatory Visit: Admitting: Internal Medicine

## 2024-06-28 ENCOUNTER — Ambulatory Visit: Admitting: Internal Medicine

## 2024-06-28 ENCOUNTER — Encounter: Payer: Self-pay | Admitting: Internal Medicine

## 2024-06-28 NOTE — Progress Notes (Deleted)
 Name: Tammy Delgado  Age/ Sex: 53 y.o., female   MRN/ DOB: [id number], Jun 29, 1971     PCP: Johnny Garnette LABOR, MD   Reason for Endocrinology Evaluation: Type 2 Diabetes Mellitus  Initial Endocrine Consultative Visit: 01/16/2021    PATIENT IDENTIFIER: Tammy Delgado is a 53 y.o. female with a past medical history of T2DM, OSA. The patient has followed with Endocrinology clinic since 01/16/2021 for consultative assistance with management of her diabetes.  DIABETIC HISTORY:  Tammy Delgado was diagnosed with DM 2014, has been on insulin  since her diagnosis. Her hemoglobin A1c has ranged from 8.7% in 2022, peaking at 11.9% in 2014.    The patient had follow-up With Dr. Kassie from May 2022 until August 2022  Of note Tammy Delgado was also seen by Dr. Trixie in 2014   Mounjaro  prescribed 05/2022 with an A1c of 9.9%  NovoLog  Mix was discontinued 10/2022 with an A1c of 6.4%  Metformin  was discontinued in February, 2025 with an A1c of 5.9%  Tammy Delgado was offered atorvastatin 04/2023.  Patient opted with lifestyle changes  SUBJECTIVE:   During the last visit (10/17/2023): A1c 5.9%  Today (06/28/2024): Tammy Delgado is here for follow-up on diabetes management.  Tammy Delgado checks her blood sugars occasionally . The patient has not had hypoglycemic episodes since the last clinic visit  Patient continues with weight loss Denies nausea or vomiting  Denies  diarrhea, uses Miralax for constipation    HOME DIABETES REGIMEN:  Mounjaro  12.5 mg weekly     Statin: No ACE-I/ARB: No    METER DOWNLOAD SUMMARY: n/a   DIABETIC COMPLICATIONS: Microvascular complications:  Denies: CKD Last Eye Exam: scheduled end 04/2023  Macrovascular complications:   Denies: CAD, CVA, PVD   HISTORY:  Past Medical History:  Past Medical History:  Diagnosis Date   Anemia    Diabetes mellitus without complication (HCC)    sees Dr. Sam   SOBOE (shortness of breath on exertion)    Past Surgical History:   Past Surgical History:  Procedure Laterality Date   CESAREAN SECTION  1995 and 2007   Social History:  reports that Tammy Delgado has never smoked. Tammy Delgado has never used smokeless tobacco. Tammy Delgado reports that Tammy Delgado does not drink alcohol and does not use drugs. Family History:  Family History  Problem Relation Age of Onset   Diabetes Mother    Hypertension Mother    Cancer Mother    Hypertension Maternal Grandmother    Diabetes Maternal Grandmother    Hypertension Father    Cancer Father      HOME MEDICATIONS: Allergies as of 06/28/2024       Reactions   Chicken Allergy Other (See Comments)        Medication List        Accurate as of June 28, 2024  7:12 AM. If you have any questions, ask your nurse or doctor.          albuterol  (2.5 MG/3ML) 0.083% nebulizer solution Commonly known as: PROVENTIL  Take 3 mLs (2.5 mg total) by nebulization every 6 (six) hours as needed for wheezing or shortness of breath.   albuterol  108 (90 Base) MCG/ACT inhaler Commonly known as: ProAir  HFA Inhale 2 puffs into the lungs every 4 (four) hours as needed for wheezing or shortness of breath.   albuterol  108 (90 Base) MCG/ACT inhaler Commonly known as: VENTOLIN  HFA Inhale 2 puffs into the lungs every 4 (four) hours as needed for wheezing or shortness of breath.   benzonatate  100  MG capsule Commonly known as: TESSALON  Take 1 capsule (100 mg total) by mouth 2 (two) times daily as needed for cough.   chlorpheniramine-HYDROcodone  10-8 MG/5ML Commonly known as: TUSSIONEX Take 5 mLs by mouth every 12 (twelve) hours as needed for cough.   ibuprofen  800 MG tablet Commonly known as: ADVIL  Take 1 tablet (800 mg total) by mouth every 8 (eight) hours as needed for mild pain (pain score 1-3).   Iron  (Ferrous Sulfate ) 325 (65 Fe) MG Tabs Take 325 mg by mouth daily.   potassium chloride  10 MEQ tablet Commonly known as: KLOR-CON  Take 1 tablet (10 mEq total) by mouth daily.   tirzepatide  12.5 MG/0.5ML  Pen Commonly known as: MOUNJARO  Inject 12.5 mg into the skin once a week.   Vitamin D  (Ergocalciferol ) 1.25 MG (50000 UNIT) Caps capsule Commonly known as: DRISDOL  Take 1 capsule (50,000 Units total) by mouth every 7 (seven) days.         OBJECTIVE:   Vital Signs: There were no vitals taken for this visit.  Wt Readings from Last 3 Encounters:  04/26/24 201 lb 6.4 oz (91.4 kg)  10/17/23 191 lb (86.6 kg)  10/13/23 184 lb (83.5 kg)     Exam: General: Pt appears well and is in NAD  Lungs: Clear with good BS bilat   Heart: RRR   Abdomen:  soft, nontender  Extremities: No pretibial edema.   Neuro: MS is good with appropriate affect, pt is alert and Ox3    DM foot exam: 04/15/2023  The skin of the feet is intact without sores or ulcerations. The pedal pulses are 2+ on right and 2+ on left. The sensation is intact to a screening 5.07, 10 gram monofilament bilaterally        DATA REVIEWED:  Lab Results  Component Value Date   HGBA1C 5.9 10/13/2023   HGBA1C 5.7 (A) 04/15/2023   HGBA1C 6.4 (A) 10/13/2022    Latest Reference Range & Units 10/13/23 10:24  Sodium 135 - 145 mEq/L 140  Potassium 3.5 - 5.1 mEq/L 3.7  Chloride 96 - 112 mEq/L 103  CO2 19 - 32 mEq/L 28  Glucose 70 - 99 mg/dL 82  BUN 6 - 23 mg/dL 15  Creatinine 9.59 - 8.79 mg/dL 9.18  Calcium 8.4 - 89.4 mg/dL 8.9  Alkaline Phosphatase 39 - 117 U/L 72  Albumin 3.5 - 5.2 g/dL 4.1  AST 0 - 37 U/L 20  ALT 0 - 35 U/L 14  Total Protein 6.0 - 8.3 g/dL 7.8  Bilirubin, Direct 0.0 - 0.3 mg/dL 0.1  Total Bilirubin 0.2 - 1.2 mg/dL 0.6  GFR >39.99 mL/min 83.60    Latest Reference Range & Units 10/13/23 10:24  Total CHOL/HDL Ratio  4  Cholesterol 0 - 200 mg/dL 860  HDL Cholesterol >60.99 mg/dL 62.39 (L)  LDL (calc) 0 - 99 mg/dL 87  NonHDL  898.58  Triglycerides 0.0 - 149.0 mg/dL 27.9  VLDL 0.0 - 59.9 mg/dL 85.5  (L): Data is abnormally low  Old records , labs and images have been reviewed.    ASSESSMENT /  PLAN / RECOMMENDATIONS:   1) Type 2 Diabetes Mellitus, Optimally  controlled, Without complications - Most recent A1c of 5.9 %. Goal A1c <7.0%.    -A1c remains optimal  -Tammy Delgado would like to increase Mounjaro  -Will discontinue Metformin      MEDICATIONS: Stop Metformin  500 mg XR, 1 tab daily Increase Mounjaro  12.5 mg weekly   EDUCATION / INSTRUCTIONS: BG monitoring instructions: Patient is  instructed to check her blood sugars 2 times a day before meals Call [place] [facility] if: BG persistently < 70  I reviewed the Rule of 15 for the treatment of hypoglycemia in detail with the patient. Literature supplied.    2) Diabetic complications:  Eye: Does not have known diabetic retinopathy.  Neuro/ Feet: Does not have known diabetic peripheral neuropathy .  Renal: Patient does not have known baseline CKD. Tammy Delgado   is not on an ACEI/ARB at present.     3) Dyslipidemia :  -Historically Tammy Delgado has had elevated LDL, atorvastatin was offered, but the patient opted to focus on lifestyle changes with dramatic improvement in LDL at 87 Mg/DL by 03/08/7973    F/U in 6 months     Signed electronically by: Stefano Redgie Butts, MD  [facility] Endocrinology  [facility] [address] [place] [address] [place], [place] [address] Phone: [phone] FAX: [phone]   CC: Johnny Garnette LABOR, MD [address] [place] [address] Phone: [phone]  Fax: [phone]  Return to Endocrinology clinic as below: Future Appointments  Date Time Provider Department Center  06/28/2024  9:50 AM Assata Juncaj, Donell Redgie, MD [facility] None

## 2024-07-12 ENCOUNTER — Ambulatory Visit: Admitting: Internal Medicine

## 2024-07-12 ENCOUNTER — Other Ambulatory Visit

## 2024-07-12 ENCOUNTER — Encounter: Payer: Self-pay | Admitting: Internal Medicine

## 2024-07-12 VITALS — BP 128/68 | HR 77 | Ht 66.0 in | Wt 197.0 lb

## 2024-07-12 DIAGNOSIS — E1165 Type 2 diabetes mellitus with hyperglycemia: Secondary | ICD-10-CM | POA: Diagnosis not present

## 2024-07-12 DIAGNOSIS — Z794 Long term (current) use of insulin: Secondary | ICD-10-CM

## 2024-07-12 DIAGNOSIS — E785 Hyperlipidemia, unspecified: Secondary | ICD-10-CM

## 2024-07-12 DIAGNOSIS — Z7985 Long-term (current) use of injectable non-insulin antidiabetic drugs: Secondary | ICD-10-CM

## 2024-07-12 DIAGNOSIS — E119 Type 2 diabetes mellitus without complications: Secondary | ICD-10-CM | POA: Diagnosis not present

## 2024-07-12 LAB — POCT GLYCOSYLATED HEMOGLOBIN (HGB A1C): Hemoglobin A1C: 5.6 % (ref 4.0–5.6)

## 2024-07-12 MED ORDER — TIRZEPATIDE 10 MG/0.5ML ~~LOC~~ SOAJ: 10.0000 mg | SUBCUTANEOUS | 3 refills | Status: AC

## 2024-07-12 MED ORDER — DEXCOM G7 SENSOR MISC: 1.0000 | 3 refills | Status: AC

## 2024-07-12 NOTE — Patient Instructions (Signed)
 Decrease  Mounjaro  10  mg once weekly    HOW TO TREAT LOW BLOOD SUGARS (Blood sugar LESS THAN 70 MG/DL) Please follow the RULE OF 15 for the treatment of hypoglycemia treatment (when your (blood sugars are less than 70 mg/dL)   STEP 1: Take 15 grams of carbohydrates when your blood sugar is low, which includes:  3-4 GLUCOSE TABS  OR 3-4 OZ OF JUICE OR REGULAR SODA OR ONE TUBE OF GLUCOSE GEL    STEP 2: RECHECK blood sugar in 15 MINUTES STEP 3: If your blood sugar is still low at the 15 minute recheck --> then, go back to STEP 1 and treat AGAIN with another 15 grams of carbohydrates.

## 2024-07-12 NOTE — Progress Notes (Signed)
 Name: Tammy Delgado  Age/ Sex: 53 y.o., female   MRN/ DOB: [id number], [birth year]     PCP: Johnny Garnette LABOR, MD   Reason for Endocrinology Evaluation: Type 2 Diabetes Mellitus  Initial Endocrine Consultative Visit: 01/16/2021    PATIENT IDENTIFIER: Tammy Delgado is a 53 y.o. female with a past medical history of T2DM, OSA. The patient has followed with Endocrinology clinic since 01/16/2021 for consultative assistance with management of her diabetes.  DIABETIC HISTORY:  Tammy Delgado was diagnosed with DM 2014, has been on insulin  since her diagnosis. Her hemoglobin A1c has ranged from 8.7% in 2022, peaking at 11.9% in 2014.    The patient had follow-up With Dr. Kassie from May 2022 until August 2022  Of note she was also seen by Dr. Trixie in 2014   Mounjaro  prescribed 05/2022 with an A1c of 9.9%  NovoLog  Mix was discontinued 10/2022 with an A1c of 6.4%  Metformin  was discontinued in February, 2025 with an A1c of 5.9%  She was offered atorvastatin 04/2023.  Patient opted with lifestyle changes  SUBJECTIVE:   During the last visit (10/17/2023): A1c 5.9%    Today (07/12/2024): Tammy Delgado is here for follow-up on diabetes management.  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, father is suffering from gastric cancer, patient admits to poor self-care due to being busy with caring for parents, she has not been taking multivitamin on a regular basis, and her eating habits have been inconsistent Weight stable No nausea  No constipation or diarrhea  Her daughter got excepted to [employer] for biomedical engineering  HOME DIABETES REGIMEN:  Mounjaro  12.5 mg weekly     Statin: No ACE-I/ARB:  No    METER DOWNLOAD SUMMARY: n/a   DIABETIC COMPLICATIONS: Microvascular complications:  Denies: CKD Last Eye Exam: scheduled end 04/10/2024  Macrovascular complications:   Denies: CAD, CVA, PVD   HISTORY:  Past Medical History:  Past Medical History:  Diagnosis Date   Anemia    Diabetes mellitus without complication (HCC)    sees Dr. Sam   SOBOE (shortness of breath on exertion)    Past Surgical History:  Past Surgical History:  Procedure Laterality Date   CESAREAN SECTION  1995 and 2007   Social History:  reports that she has never smoked. She has never used smokeless tobacco. She reports that she does not drink alcohol and does not use drugs. Family History:  Family History  Problem Relation Age of Onset   Diabetes Mother    Hypertension Mother    Cancer Mother    Hypertension Maternal Grandmother    Diabetes Maternal Grandmother    Hypertension Father    Cancer Father      HOME MEDICATIONS: Allergies as of 07/12/2024       Reactions   Chicken Allergy Other (See Comments)        Medication List        Accurate as of July 12, 2024  9:28 AM. If you have any questions, ask your nurse or doctor.          STOP taking these medications    benzonatate  100 MG capsule Commonly known as: TESSALON  Stopped by: Donell  J Yulian Gosney   chlorpheniramine-HYDROcodone  10-8 MG/5ML Commonly known as: TUSSIONEX Stopped by: Corley Maffeo J Luismanuel Corman       TAKE these medications    albuterol  (2.5 MG/3ML) 0.083% nebulizer solution Commonly known as: PROVENTIL  Take 3 mLs (2.5 mg total) by nebulization every 6 (six) hours as needed for wheezing or shortness of breath.   albuterol  108 (90 Base) MCG/ACT inhaler Commonly known as: ProAir  HFA Inhale 2 puffs into the lungs every 4 (four) hours as needed for wheezing or shortness of breath.   albuterol  108 (90 Base) MCG/ACT inhaler Commonly known as: VENTOLIN  HFA Inhale 2 puffs into the lungs every 4  (four) hours as needed for wheezing or shortness of breath.   ibuprofen  800 MG tablet Commonly known as: ADVIL  Take 1 tablet (800 mg total) by mouth every 8 (eight) hours as needed for mild pain (pain score 1-3).   Iron  (Ferrous Sulfate ) 325 (65 Fe) MG Tabs Take 325 mg by mouth daily.   potassium chloride  10 MEQ tablet Commonly known as: KLOR-CON  Take 1 tablet (10 mEq total) by mouth daily.   ReliOn Premier Classic Devi by Does not apply route.   ReliOn Premier Test test strip Generic drug: glucose blood 1 each by Other route as needed for other. Use as instructed   tirzepatide  12.5 MG/0.5ML Pen Commonly known as: MOUNJARO  Inject 12.5 mg into the skin once a week.   Vitamin D  (Ergocalciferol ) 1.25 MG (50000 UNIT) Caps capsule Commonly known as: DRISDOL  Take 1 capsule (50,000 Units total) by mouth every 7 (seven) days.         OBJECTIVE:   Vital Signs: BP 128/68   Pulse 77   Ht 5' 6 (1.676 m)   Wt 197 lb (89.4 kg)   SpO2 99%   BMI 31.80 kg/m   Wt Readings from Last 3 Encounters:  07/12/24 197 lb (89.4 kg)  04/26/24 201 lb 6.4 oz (91.4 kg)  10/17/23 191 lb (86.6 kg)     Exam: General: Pt appears well and is in NAD  Lungs: Clear with good BS bilat   Heart: RRR   Abdomen:  soft, nontender  Extremities: No pretibial edema.   Neuro: MS is good with appropriate affect, pt is alert and Ox3    DM foot exam: 07/12/2024  The skin of the feet is intact without sores or ulcerations. The pedal pulses are 2+ on right and 2+ on left. The sensation is intact to a screening 5.07, 10 gram monofilament bilaterally        DATA REVIEWED:  Lab Results  Component Value Date   HGBA1C 5.6 07/12/2024   HGBA1C 5.9 10/13/2023   HGBA1C 5.7 (A) 04/15/2023    Latest Reference Range & Units 10/13/23 10:24  Sodium 135 - 145 mEq/L 140  Potassium 3.5 - 5.1 mEq/L 3.7  Chloride 96 - 112 mEq/L 103  CO2 19 - 32 mEq/L 28  Glucose 70 - 99 mg/dL 82  BUN 6 - 23 mg/dL 15   Creatinine 9.59 - 1.20 mg/dL 9.18  Calcium 8.4 - 89.4 mg/dL 8.9  Alkaline Phosphatase 39 - 117 U/L 72  Albumin 3.5 - 5.2 g/dL 4.1  AST 0 - 37 U/L 20  ALT 0 - 35 U/L 14  Total Protein 6.0 - 8.3 g/dL 7.8  Bilirubin, Direct 0.0 - 0.3 mg/dL 0.1  Total Bilirubin 0.2 - 1.2 mg/dL 0.6  GFR >39.99 mL/min 83.60    Latest Reference Range & Units 10/13/23 10:24  Total CHOL/HDL Ratio  4  Cholesterol 0 - 200 mg/dL 860  HDL Cholesterol >60.99 mg/dL 62.39 (L)  LDL (calc) 0 - 99 mg/dL 87  NonHDL  898.58  Triglycerides 0.0 - 149.0 mg/dL 27.9  VLDL 0.0 - 59.9 mg/dL 85.5  (L): Data is abnormally low  Old records , labs and images have been reviewed.    ASSESSMENT / PLAN / RECOMMENDATIONS:   1) Type 2 Diabetes Mellitus, Optimally  controlled, Without complications - Most recent A1c of 5.6 %. Goal A1c <7.0%.    -A1c remains optimal  - We discontinued metformin  with an A1c of 5.9% -The patient has noted better outcome with a lower dose of Mounjaro  10 mg, she would like to decrease this - Emphasized the importance of eating at least 2 well-balanced meals a day to prevent hypoglycemia - I have upgraded CGM to Dexcom G7   MEDICATIONS:  Stop Mounjaro  12.5 mg weekly Start Mounjaro  10 mg weekly   EDUCATION / INSTRUCTIONS: BG monitoring instructions: Patient is instructed to check her blood sugars 2 times a day before meals Call [facility] if: BG persistently < 70  I reviewed the Rule of 15 for the treatment of hypoglycemia in detail with the patient. Literature supplied.    2) Diabetic complications:  Eye: Does not have known diabetic retinopathy.  Neuro/ Feet: Does not have known diabetic peripheral neuropathy .  Renal: Patient does not have known baseline CKD. She   is not on an ACEI/ARB at present.     3) Dyslipidemia :  -Historically she has had elevated LDL, atorvastatin was offered, but the patient opted to focus on lifestyle changes with dramatic improvement in  LDL at 87 Mg/DL by 03/08/7973    F/U in 6 months     I spent 25 minutes preparing to see the patient by review of recent labs, imaging and procedures, obtaining and reviewing separately obtained history, communicating with the patient/family or caregiver, ordering medications, tests or procedures, and documenting clinical information in the EHR including the differential Dx, treatment, and any further evaluation and other management   Signed electronically by: Stefano Redgie Butts, MD  [facility]  [facility] [address] [place] [place], [place] [address] Phone: [phone] FAX: [phone]   CC: Johnny Garnette LABOR, MD [address] [place] [address] Phone: [phone]  Fax: [phone]  Return to Endocrinology clinic as below: No future appointments.

## 2024-07-13 ENCOUNTER — Ambulatory Visit: Payer: Self-pay | Admitting: Internal Medicine

## 2024-07-13 LAB — MICROALBUMIN / CREATININE URINE RATIO
Creatinine, Urine: 258 mg/dL (ref 20–275)
Microalb Creat Ratio: 16 mg/g{creat} (ref <30)
Microalb, Ur: 4.1 mg/dL

## 2024-08-08 DIAGNOSIS — J020 Streptococcal pharyngitis: Secondary | ICD-10-CM | POA: Diagnosis not present

## 2024-08-09 ENCOUNTER — Telehealth: Payer: Self-pay | Admitting: Pharmacy Technician

## 2024-08-09 ENCOUNTER — Other Ambulatory Visit (HOSPITAL_COMMUNITY): Payer: Self-pay

## 2024-08-09 NOTE — Telephone Encounter (Signed)
 Pharmacy Patient Advocate Encounter   Received notification from CoverMyMeds that prior authorization for Dexcom G7 Sensor is required/requested.   Insurance verification completed.   The patient is insured through [facility].   Per test claim: Insulin  use is required for a PA approval. Upon reviewing this patient's chart, it looks like she does not take insulin  anymore. If that is the case, then the PA will be cancelled.

## 2024-08-10 NOTE — Telephone Encounter (Signed)
How would you like to proceed? 

## 2024-08-10 NOTE — Telephone Encounter (Signed)
 Just let the patient know that Dexcom is not covered as she is not on insulin  and she did not qualify.

## 2024-09-17 ENCOUNTER — Other Ambulatory Visit: Payer: Self-pay | Admitting: Internal Medicine

## 2025-01-10 ENCOUNTER — Ambulatory Visit: Admitting: Internal Medicine
